# Patient Record
Sex: Female | Born: 1935 | Race: White | Hispanic: No | Smoking: Former smoker
Health system: Southern US, Community
[De-identification: ages and names within clinical notes are randomized; demographics above are authoritative.]

## PROBLEM LIST (undated history)

## (undated) DIAGNOSIS — F0280 Dementia in other diseases classified elsewhere without behavioral disturbance: Secondary | ICD-10-CM

## (undated) DIAGNOSIS — E042 Nontoxic multinodular goiter: Secondary | ICD-10-CM

## (undated) DIAGNOSIS — R634 Abnormal weight loss: Secondary | ICD-10-CM

## (undated) DIAGNOSIS — K5900 Constipation, unspecified: Secondary | ICD-10-CM

## (undated) DIAGNOSIS — F419 Anxiety disorder, unspecified: Secondary | ICD-10-CM

## (undated) DIAGNOSIS — K5730 Diverticulosis of large intestine without perforation or abscess without bleeding: Secondary | ICD-10-CM

## (undated) DIAGNOSIS — S42209A Unspecified fracture of upper end of unspecified humerus, initial encounter for closed fracture: Secondary | ICD-10-CM

## (undated) DIAGNOSIS — G309 Alzheimer's disease, unspecified: Principal | ICD-10-CM

## (undated) DIAGNOSIS — I10 Essential (primary) hypertension: Secondary | ICD-10-CM

## (undated) DIAGNOSIS — N8111 Cystocele, midline: Secondary | ICD-10-CM

## (undated) DIAGNOSIS — M25519 Pain in unspecified shoulder: Secondary | ICD-10-CM

## (undated) DIAGNOSIS — I4891 Unspecified atrial fibrillation: Secondary | ICD-10-CM

## (undated) DIAGNOSIS — G8929 Other chronic pain: Secondary | ICD-10-CM

## (undated) DIAGNOSIS — R627 Adult failure to thrive: Secondary | ICD-10-CM

## (undated) DIAGNOSIS — S72309A Unspecified fracture of shaft of unspecified femur, initial encounter for closed fracture: Secondary | ICD-10-CM

## (undated) HISTORY — DX: Nontoxic multinodular goiter: E04.2

## (undated) HISTORY — DX: Cystocele, midline: N81.11

## (undated) HISTORY — PX: TONSILLECTOMY: SHX5217

## (undated) HISTORY — DX: Essential (primary) hypertension: I10

## (undated) HISTORY — DX: Unspecified atrial fibrillation: I48.91

## (undated) HISTORY — DX: Other chronic pain: G89.29

## (undated) HISTORY — DX: Alzheimer's disease, unspecified: G30.9

## (undated) HISTORY — DX: Adult failure to thrive: R62.7

## (undated) HISTORY — DX: Unspecified fracture of shaft of unspecified femur, initial encounter for closed fracture: S72.309A

## (undated) HISTORY — DX: Abnormal weight loss: R63.4

## (undated) HISTORY — DX: Unspecified fracture of upper end of unspecified humerus, initial encounter for closed fracture: S42.209A

## (undated) HISTORY — DX: Constipation, unspecified: K59.00

## (undated) HISTORY — DX: Anxiety disorder, unspecified: F41.9

## (undated) HISTORY — DX: Dementia in other diseases classified elsewhere without behavioral disturbance: F02.80

## (undated) HISTORY — DX: Pain in unspecified shoulder: M25.519

## (undated) HISTORY — DX: Diverticulosis of large intestine without perforation or abscess without bleeding: K57.30

---

## 1998-01-22 ENCOUNTER — Encounter (HOSPITAL_COMMUNITY): Admission: RE | Admit: 1998-01-22 | Discharge: 1998-04-22 | Payer: Self-pay | Admitting: Cardiology

## 1998-04-09 ENCOUNTER — Encounter (HOSPITAL_COMMUNITY): Admission: RE | Admit: 1998-04-09 | Discharge: 1998-07-08 | Payer: Self-pay | Admitting: Cardiology

## 1998-05-26 ENCOUNTER — Ambulatory Visit (HOSPITAL_COMMUNITY): Admission: RE | Admit: 1998-05-26 | Discharge: 1998-05-26 | Payer: Self-pay | Admitting: Family Medicine

## 1998-05-26 ENCOUNTER — Encounter: Payer: Self-pay | Admitting: Family Medicine

## 1998-12-11 ENCOUNTER — Ambulatory Visit (HOSPITAL_COMMUNITY): Admission: RE | Admit: 1998-12-11 | Discharge: 1998-12-11 | Payer: Self-pay | Admitting: Family Medicine

## 1999-01-12 ENCOUNTER — Other Ambulatory Visit: Admission: RE | Admit: 1999-01-12 | Discharge: 1999-01-12 | Payer: Self-pay | Admitting: Family Medicine

## 1999-01-18 ENCOUNTER — Encounter: Payer: Self-pay | Admitting: Family Medicine

## 1999-01-18 ENCOUNTER — Ambulatory Visit (HOSPITAL_COMMUNITY): Admission: RE | Admit: 1999-01-18 | Discharge: 1999-01-18 | Payer: Self-pay | Admitting: Family Medicine

## 1999-10-18 ENCOUNTER — Encounter: Admission: RE | Admit: 1999-10-18 | Discharge: 1999-10-18 | Payer: Self-pay | Admitting: Family Medicine

## 1999-10-18 ENCOUNTER — Encounter: Payer: Self-pay | Admitting: Family Medicine

## 2000-01-28 ENCOUNTER — Emergency Department (HOSPITAL_COMMUNITY): Admission: EM | Admit: 2000-01-28 | Discharge: 2000-01-28 | Payer: Self-pay | Admitting: Emergency Medicine

## 2000-01-29 ENCOUNTER — Encounter: Payer: Self-pay | Admitting: Emergency Medicine

## 2000-02-01 ENCOUNTER — Encounter: Admission: RE | Admit: 2000-02-01 | Discharge: 2000-02-01 | Payer: Self-pay | Admitting: Family Medicine

## 2000-02-01 ENCOUNTER — Encounter: Payer: Self-pay | Admitting: Family Medicine

## 2000-02-28 ENCOUNTER — Ambulatory Visit (HOSPITAL_COMMUNITY): Admission: RE | Admit: 2000-02-28 | Discharge: 2000-02-28 | Payer: Self-pay | Admitting: Urology

## 2000-04-13 ENCOUNTER — Encounter: Admission: RE | Admit: 2000-04-13 | Discharge: 2000-04-13 | Payer: Self-pay | Admitting: Family Medicine

## 2000-04-13 ENCOUNTER — Encounter: Payer: Self-pay | Admitting: Family Medicine

## 2000-11-07 ENCOUNTER — Encounter: Admission: RE | Admit: 2000-11-07 | Discharge: 2000-11-07 | Payer: Self-pay | Admitting: Family Medicine

## 2000-11-07 ENCOUNTER — Encounter: Payer: Self-pay | Admitting: Family Medicine

## 2001-10-10 DIAGNOSIS — F419 Anxiety disorder, unspecified: Secondary | ICD-10-CM

## 2001-10-10 DIAGNOSIS — I10 Essential (primary) hypertension: Secondary | ICD-10-CM

## 2001-10-10 DIAGNOSIS — I4891 Unspecified atrial fibrillation: Secondary | ICD-10-CM

## 2001-10-10 DIAGNOSIS — K5730 Diverticulosis of large intestine without perforation or abscess without bleeding: Secondary | ICD-10-CM

## 2001-10-10 HISTORY — DX: Unspecified atrial fibrillation: I48.91

## 2001-10-10 HISTORY — DX: Essential (primary) hypertension: I10

## 2001-10-10 HISTORY — DX: Anxiety disorder, unspecified: F41.9

## 2001-10-10 HISTORY — DX: Diverticulosis of large intestine without perforation or abscess without bleeding: K57.30

## 2002-08-02 ENCOUNTER — Encounter: Payer: Self-pay | Admitting: Internal Medicine

## 2002-08-02 ENCOUNTER — Ambulatory Visit (HOSPITAL_COMMUNITY): Admission: RE | Admit: 2002-08-02 | Discharge: 2002-08-02 | Payer: Self-pay | Admitting: Internal Medicine

## 2002-08-05 ENCOUNTER — Ambulatory Visit (HOSPITAL_COMMUNITY): Admission: RE | Admit: 2002-08-05 | Discharge: 2002-08-06 | Payer: Self-pay | Admitting: Internal Medicine

## 2002-08-05 ENCOUNTER — Encounter: Payer: Self-pay | Admitting: Internal Medicine

## 2002-08-06 ENCOUNTER — Encounter: Payer: Self-pay | Admitting: Internal Medicine

## 2002-09-12 ENCOUNTER — Encounter: Payer: Self-pay | Admitting: Internal Medicine

## 2002-09-12 ENCOUNTER — Ambulatory Visit (HOSPITAL_COMMUNITY): Admission: RE | Admit: 2002-09-12 | Discharge: 2002-09-12 | Payer: Self-pay | Admitting: Internal Medicine

## 2002-10-10 DIAGNOSIS — F0280 Dementia in other diseases classified elsewhere without behavioral disturbance: Secondary | ICD-10-CM

## 2002-10-10 HISTORY — DX: Dementia in other diseases classified elsewhere, unspecified severity, without behavioral disturbance, psychotic disturbance, mood disturbance, and anxiety: F02.80

## 2003-03-16 ENCOUNTER — Emergency Department (HOSPITAL_COMMUNITY): Admission: EM | Admit: 2003-03-16 | Discharge: 2003-03-16 | Payer: Self-pay | Admitting: Emergency Medicine

## 2004-01-30 ENCOUNTER — Other Ambulatory Visit: Admission: RE | Admit: 2004-01-30 | Discharge: 2004-01-30 | Payer: Self-pay | Admitting: Obstetrics and Gynecology

## 2004-02-04 ENCOUNTER — Ambulatory Visit (HOSPITAL_COMMUNITY): Admission: RE | Admit: 2004-02-04 | Discharge: 2004-02-04 | Payer: Self-pay | Admitting: Obstetrics and Gynecology

## 2005-02-17 ENCOUNTER — Ambulatory Visit (HOSPITAL_COMMUNITY): Admission: RE | Admit: 2005-02-17 | Discharge: 2005-02-17 | Payer: Self-pay | Admitting: Internal Medicine

## 2005-02-28 ENCOUNTER — Ambulatory Visit: Payer: Self-pay

## 2005-06-08 ENCOUNTER — Ambulatory Visit: Payer: Self-pay | Admitting: Internal Medicine

## 2005-09-07 ENCOUNTER — Ambulatory Visit: Payer: Self-pay | Admitting: Internal Medicine

## 2005-10-10 DIAGNOSIS — S42209A Unspecified fracture of upper end of unspecified humerus, initial encounter for closed fracture: Secondary | ICD-10-CM

## 2005-10-10 HISTORY — DX: Unspecified fracture of upper end of unspecified humerus, initial encounter for closed fracture: S42.209A

## 2005-10-12 ENCOUNTER — Ambulatory Visit: Payer: Self-pay | Admitting: Internal Medicine

## 2005-11-24 ENCOUNTER — Ambulatory Visit: Payer: Self-pay | Admitting: Internal Medicine

## 2005-12-18 ENCOUNTER — Ambulatory Visit: Payer: Self-pay | Admitting: Cardiology

## 2005-12-18 ENCOUNTER — Inpatient Hospital Stay (HOSPITAL_COMMUNITY): Admission: EM | Admit: 2005-12-18 | Discharge: 2005-12-27 | Payer: Self-pay | Admitting: Emergency Medicine

## 2005-12-23 ENCOUNTER — Encounter: Payer: Self-pay | Admitting: Internal Medicine

## 2005-12-30 ENCOUNTER — Ambulatory Visit: Payer: Self-pay | Admitting: Internal Medicine

## 2006-02-16 ENCOUNTER — Ambulatory Visit: Payer: Self-pay

## 2006-03-10 HISTORY — PX: OTHER SURGICAL HISTORY: SHX169

## 2006-03-30 ENCOUNTER — Ambulatory Visit: Payer: Self-pay | Admitting: Internal Medicine

## 2006-03-31 ENCOUNTER — Emergency Department (HOSPITAL_COMMUNITY): Admission: EM | Admit: 2006-03-31 | Discharge: 2006-04-01 | Payer: Self-pay | Admitting: Emergency Medicine

## 2006-06-21 ENCOUNTER — Emergency Department (HOSPITAL_COMMUNITY): Admission: EM | Admit: 2006-06-21 | Discharge: 2006-06-21 | Payer: Self-pay | Admitting: Emergency Medicine

## 2007-04-05 ENCOUNTER — Ambulatory Visit: Payer: Self-pay | Admitting: Internal Medicine

## 2007-04-05 LAB — CONVERTED CEMR LAB
CO2: 31 meq/L (ref 19–32)
Chloride: 106 meq/L (ref 96–112)
Eosinophils Relative: 2.9 % (ref 0.0–5.0)
GFR calc non Af Amer: 55 mL/min
Glucose, Bld: 86 mg/dL (ref 70–99)
HCT: 35.4 % — ABNORMAL LOW (ref 36.0–46.0)
Hemoglobin: 12.2 g/dL (ref 12.0–15.0)
MCHC: 34.4 g/dL (ref 30.0–36.0)
Monocytes Relative: 6.5 % (ref 3.0–11.0)
Potassium: 4 meq/L (ref 3.5–5.1)
RBC: 3.85 M/uL — ABNORMAL LOW (ref 3.87–5.11)
RDW: 14.5 % (ref 11.5–14.6)
Sodium: 141 meq/L (ref 135–145)

## 2007-05-18 ENCOUNTER — Ambulatory Visit: Payer: Self-pay | Admitting: Internal Medicine

## 2007-06-09 ENCOUNTER — Emergency Department (HOSPITAL_COMMUNITY): Admission: EM | Admit: 2007-06-09 | Discharge: 2007-06-10 | Payer: Self-pay | Admitting: Surgery

## 2007-06-13 ENCOUNTER — Ambulatory Visit: Payer: Self-pay | Admitting: Internal Medicine

## 2007-07-11 ENCOUNTER — Ambulatory Visit: Payer: Self-pay | Admitting: Internal Medicine

## 2007-08-08 ENCOUNTER — Ambulatory Visit: Payer: Self-pay | Admitting: Internal Medicine

## 2007-09-05 ENCOUNTER — Ambulatory Visit: Payer: Self-pay | Admitting: Internal Medicine

## 2007-10-03 ENCOUNTER — Ambulatory Visit: Payer: Self-pay | Admitting: Internal Medicine

## 2007-10-11 DIAGNOSIS — S72309A Unspecified fracture of shaft of unspecified femur, initial encounter for closed fracture: Secondary | ICD-10-CM

## 2007-10-11 HISTORY — PX: ORIF HIP FRACTURE: SHX2125

## 2007-10-11 HISTORY — DX: Unspecified fracture of shaft of unspecified femur, initial encounter for closed fracture: S72.309A

## 2007-10-14 ENCOUNTER — Ambulatory Visit: Payer: Self-pay | Admitting: Cardiology

## 2007-10-14 ENCOUNTER — Inpatient Hospital Stay (HOSPITAL_COMMUNITY): Admission: EM | Admit: 2007-10-14 | Discharge: 2007-10-25 | Payer: Self-pay | Admitting: Emergency Medicine

## 2007-12-03 ENCOUNTER — Ambulatory Visit: Payer: Self-pay | Admitting: Internal Medicine

## 2008-03-04 ENCOUNTER — Ambulatory Visit: Payer: Self-pay | Admitting: Internal Medicine

## 2008-03-25 ENCOUNTER — Ambulatory Visit: Payer: Self-pay | Admitting: Internal Medicine

## 2008-06-03 ENCOUNTER — Ambulatory Visit: Payer: Self-pay | Admitting: Internal Medicine

## 2008-07-26 IMAGING — RF DG HIP OPERATIVE*R*
1 series · 4 of 4 positions shown · non-contrast
Comparison: [HOSPITAL] preoperative right hip radiograph 10/14/07.

CLINICAL DATA: ORIF right hip fractures.
 RIGHT HIP OPERATIVE ? 4 VIEWS ? 10/17/07:

[Series 1: run · 4 of 4 slices shown]
[im 1/4]
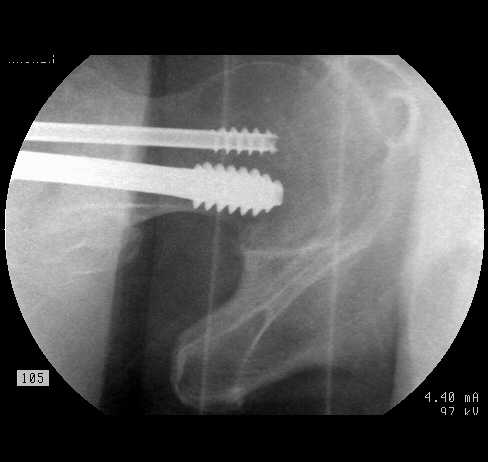
[im 2/4]
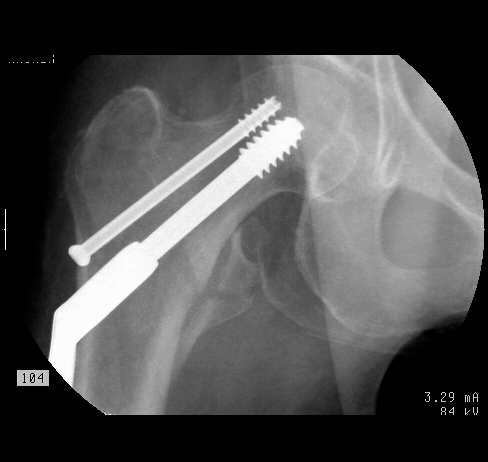
[im 3/4]
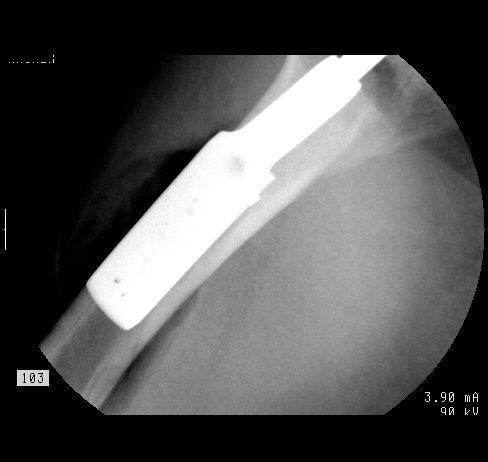
[im 4/4]
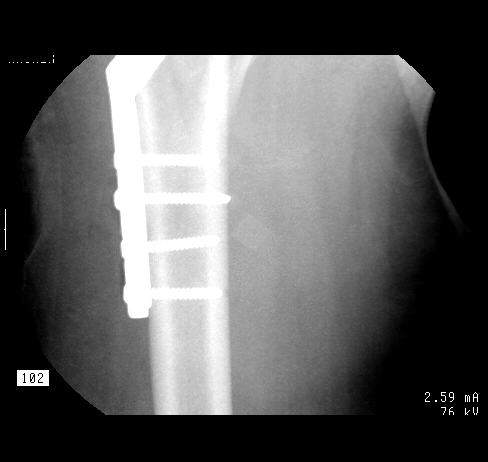

[4 of 4 positions shown; findings below may reference images not displayed]

FINDINGS: Interval placement of right hip screws and external plate and screws transfix the previous varus angulated comminuted right femoral intertrochanteric fracture with the major fracture fragments in satisfactory position and alignment.  Metallic screws end in satisfactory position at central right femoral head.
IMPRESSION: Satisfactory open reduction and internal fixation of previous varus angulated comminuted right intertrochanteric femoral fracture.

## 2008-10-10 DIAGNOSIS — N8111 Cystocele, midline: Secondary | ICD-10-CM

## 2008-10-10 HISTORY — DX: Cystocele, midline: N81.11

## 2008-10-14 ENCOUNTER — Ambulatory Visit: Payer: Self-pay | Admitting: Internal Medicine

## 2009-01-16 ENCOUNTER — Encounter (INDEPENDENT_AMBULATORY_CARE_PROVIDER_SITE_OTHER): Payer: Self-pay | Admitting: Radiology

## 2009-02-06 ENCOUNTER — Ambulatory Visit: Payer: Self-pay | Admitting: Internal Medicine

## 2009-02-21 DIAGNOSIS — I10 Essential (primary) hypertension: Secondary | ICD-10-CM | POA: Insufficient documentation

## 2009-02-21 DIAGNOSIS — K589 Irritable bowel syndrome without diarrhea: Secondary | ICD-10-CM | POA: Insufficient documentation

## 2009-02-21 DIAGNOSIS — G309 Alzheimer's disease, unspecified: Secondary | ICD-10-CM

## 2009-02-21 DIAGNOSIS — K5730 Diverticulosis of large intestine without perforation or abscess without bleeding: Secondary | ICD-10-CM | POA: Insufficient documentation

## 2009-02-21 DIAGNOSIS — Z950 Presence of cardiac pacemaker: Secondary | ICD-10-CM | POA: Insufficient documentation

## 2009-02-21 DIAGNOSIS — I498 Other specified cardiac arrhythmias: Secondary | ICD-10-CM | POA: Insufficient documentation

## 2009-02-21 DIAGNOSIS — F0280 Dementia in other diseases classified elsewhere without behavioral disturbance: Secondary | ICD-10-CM | POA: Insufficient documentation

## 2009-02-21 DIAGNOSIS — I4891 Unspecified atrial fibrillation: Secondary | ICD-10-CM

## 2009-04-16 ENCOUNTER — Ambulatory Visit: Payer: Self-pay

## 2009-04-16 ENCOUNTER — Encounter: Payer: Self-pay | Admitting: Internal Medicine

## 2009-08-07 ENCOUNTER — Ambulatory Visit: Payer: Self-pay | Admitting: Internal Medicine

## 2009-10-10 DIAGNOSIS — R634 Abnormal weight loss: Secondary | ICD-10-CM

## 2009-10-10 DIAGNOSIS — M25519 Pain in unspecified shoulder: Secondary | ICD-10-CM

## 2009-10-10 DIAGNOSIS — R627 Adult failure to thrive: Secondary | ICD-10-CM

## 2009-10-10 HISTORY — DX: Adult failure to thrive: R62.7

## 2009-10-10 HISTORY — DX: Abnormal weight loss: R63.4

## 2009-10-10 HISTORY — DX: Pain in unspecified shoulder: M25.519

## 2009-10-13 ENCOUNTER — Ambulatory Visit: Payer: Self-pay | Admitting: Internal Medicine

## 2010-01-01 ENCOUNTER — Ambulatory Visit: Payer: Self-pay | Admitting: Internal Medicine

## 2010-06-07 ENCOUNTER — Emergency Department (HOSPITAL_COMMUNITY): Admission: EM | Admit: 2010-06-07 | Discharge: 2010-06-08 | Payer: Self-pay | Admitting: Emergency Medicine

## 2010-08-06 ENCOUNTER — Encounter (INDEPENDENT_AMBULATORY_CARE_PROVIDER_SITE_OTHER): Payer: Self-pay | Admitting: *Deleted

## 2010-11-08 ENCOUNTER — Encounter: Payer: Self-pay | Admitting: Internal Medicine

## 2010-11-11 NOTE — Assessment & Plan Note (Signed)
Summary: pacer check/medtronic   CC:  pacer check.  History of Present Illness:    [name] is seen in followup for pacemaker implanted years ago for symptomatic bradycardia. She also has severe dementia. She denies symptoms of chest pain shortness of breath peripheral edema.  Current Medications (verified): 1)  Norvasc 5 Mg Tabs (Amlodipine Besylate) .... One By Mouth Daily 2)  Zoloft 50 Mg Tabs (Sertraline Hcl) .... Take 2 and 1/2 By Mouth Once Daily 3)  Restasis 0.05 % Emul (Cyclosporine) .... One Drop Both Eyes Two Times A Day 4)  Namenda 10 Mg Tabs (Memantine Hcl) .... One By Mouth Two Times A Day 5)  Tylenol Extra Strength 500 Mg Tabs (Acetaminophen) .... Two By Mouth Two Times A Day 6)  Depakote 250 Mg Tbec (Divalproex Sodium) .... One By Mouth Three Times A Day 7)  Aricept 5 Mg Tabs (Donepezil Hcl) .... One By Mouth At Bedtime 8)  Ativan 0.5 Mg Tabs (Lorazepam) .... One Half By Mouth Two Times A Day As Needed 9)  Xalatan 0.005 % Soln (Latanoprost) .Marland Kitchen.. 1 Gtt in Affected Eye Daily 10)  Vicodin 5-500 Mg Tabs (Hydrocodone-Acetaminophen) .... As Needed  Allergies (verified): 1)  * Codeine 2)  * Hydrocodone  Past History:  Past Medical History: Last updated: 02/21/2009 BRADYCARDIA (ICD-427.89) ALZHEIMER'S DISEASE (ICD-331.0) IRRITABLE BOWEL SYNDROME (ICD-564.1) DIVERTICULOSIS, COLON (ICD-562.10) HYPERTENSION, UNSPECIFIED (ICD-401.9) ATRIAL FIBRILLATION (ICD-427.31) PACEMAKER (ICD-V45.Marland Kitchen01)    Past Surgical History: Last updated: 02/21/2009 hip - 10/2007 pacemaker - 10/03 - Medtronic  Social History: Last updated: 02/21/2009 Tobacco Use - No.  Alcohol Use - no  Vital Signs:  Patient profile:   77 year old female Height:      65 inches Pulse rate:   71 / minute Pulse rhythm:   regular BP sitting:   103 / 58  (left arm) Cuff size:   regular  Vitals Entered By: Judithe Modest CMA (October 13, 2009 3:38 PM)  Physical Exam  General:  The patient was alert  and oriented in no acute distress.she is oriented to name only HEENT Normal with few teeth  Neck veins were flat,   Lungs were clear.  Heart sounds were regular without murmurs or  . There is no clubbing cyanosis or edema. Skin Warm and dry    PPM Specifications Following MD:  Sherryl Manges, MD     PPM Vendor:  Medtronic     PPM Model Number:  [id number]     PPM Serial Number:  [id number] PPM DOI:  08/07/2002     PPM Implanting MD:  Sherryl Manges, MD  Lead 1    Location: RV     DOI: 08/07/2002     Model #: [id number]     Serial #: [id number]     Status: active  Magnet Response Rate:  BOL 85 ERI  65  Indications:  CHB   PPM Follow Up Remote Check?  No Battery Voltage:  2.76 V     Battery Est. Longevity:  2.5 years     Pacer Dependent:  Yes     Right Ventricle  Amplitude: 2.8 mV, Impedance: 563 ohms, Threshold: 0.5 V at 0.4 msec  Episodes Coumadin:  No Ventricular High Rate:  3     Ventricular Pacing:  98.4%  Parameters Mode:  VVIR     Lower Rate Limit:  70     Upper Rate Limit:  120 Next Cardiology Appt Due:  04/09/2010 Tech Comments:  No parameter changes.  3 VHR  episodes the longest 2 seconds, 0.3% heart rates >180bpm.  Rate response adequate for the patient's level of activity.  She is wheelchair restricted.  ROV 6 months clinic. Altha Harm, LPN  October 13, 2009 3:49 PM  MD Comments:  review  Impression & Recommendations:  Problem # 1:  BRADYCARDIA (ICD-427.89) she is almost 100% ventricularly paced. Her updated medication list for this problem includes:    Norvasc 5 Mg Tabs (Amlodipine besylate) ..... One by mouth daily  Problem # 2:  PACEMAKER (ICD-V45.Marland Kitchen01) normal device function

## 2010-11-11 NOTE — Miscellaneous (Signed)
Summary: dx correction  Clinical Lists Changes  Problems: Changed problem from PACEMAKER (ICD-V45..01) to PACEMAKER, PERMANENT (ICD-V45.01)  changed the incorrect dx code to correct dx code 

## 2010-11-11 NOTE — Cardiovascular Report (Signed)
Summary: TTM  TTM   Imported By: Roderic Ovens 01/12/2010 16:03:31  _____________________________________________________________________  External Attachment:    Type:   Image     Comment:   External Document

## 2010-11-12 NOTE — Cardiovascular Report (Signed)
Summary: Office Visit   Office Visit   Imported By: Roderic Ovens 10/15/2009 15:23:22  _____________________________________________________________________  External Attachment:    Type:   Image     Comment:   External Document

## 2010-11-17 NOTE — Miscellaneous (Signed)
Summary: Corrected device information  Clinical Lists Changes  Observations: Added new observation of PPM MODL#: [id number] (11/08/2010 8:40)      PPM Specifications Following MD:  Sherryl Manges, MD     PPM Vendor:  Medtronic     PPM Model Number:  [id number]     PPM Serial Number:  [id number] PPM DOI:  08/07/2002     PPM Implanting MD:  Sherryl Manges, MD  Lead 1    Location: RV     DOI: 08/07/2002     Model #: [id number]     Serial #: [id number]     Status: active  Magnet Response Rate:  BOL 85 ERI  65  Indications:  CHB   PPM Follow Up Pacer Dependent:  Yes      Episodes Coumadin:  No  Parameters Mode:  VVIR     Lower Rate Limit:  70     Upper Rate Limit:  120

## 2010-12-23 LAB — PROTIME-INR
INR: 1.21 (ref 0.00–1.49)
Prothrombin Time: 15.5 seconds — ABNORMAL HIGH (ref 11.6–15.2)

## 2010-12-23 LAB — POCT I-STAT, CHEM 8
BUN: 19 mg/dL (ref 6–23)
Calcium, Ion: 0.98 mmol/L — ABNORMAL LOW (ref 1.12–1.32)
Chloride: 104 meq/L (ref 96–112)
Creatinine, Ser: 0.8 mg/dL (ref 0.4–1.2)
Glucose, Bld: 159 mg/dL — ABNORMAL HIGH (ref 70–99)
HCT: 39 % (ref 36.0–46.0)
Hemoglobin: 13.3 g/dL (ref 12.0–15.0)
Potassium: 3.8 meq/L (ref 3.5–5.1)
Sodium: 139 meq/L (ref 135–145)
TCO2: 23 mmol/L (ref 0–100)

## 2010-12-23 LAB — URINE MICROSCOPIC-ADD ON

## 2010-12-23 LAB — URINE CULTURE
Colony Count: >=100000
Culture  Setup Time: 201108290100

## 2010-12-23 LAB — DIFFERENTIAL
Basophils Absolute: 0 10*3/uL (ref 0.0–0.1)
Basophils Relative: 0 % (ref 0–1)
Neutro Abs: 6 10*3/uL (ref 1.7–7.7)
Neutrophils Relative %: 75 % (ref 43–77)

## 2010-12-23 LAB — CLOSTRIDIUM DIFFICILE EIA

## 2010-12-23 LAB — CULTURE, BLOOD (ROUTINE X 2): Culture: NO GROWTH

## 2010-12-23 LAB — CBC
Hemoglobin: 12.2 g/dL (ref 12.0–15.0)
MCHC: 33.2 g/dL (ref 30.0–36.0)
RDW: 13.8 % (ref 11.5–15.5)

## 2010-12-23 LAB — URINALYSIS, ROUTINE W REFLEX MICROSCOPIC
Glucose, UA: NEGATIVE mg/dL
Specific Gravity, Urine: 1.009 (ref 1.005–1.030)
Urobilinogen, UA: 1 mg/dL (ref 0.0–1.0)

## 2010-12-23 LAB — HEMOCCULT GUIAC POC 1CARD (OFFICE): Fecal Occult Bld: NEGATIVE

## 2011-02-22 NOTE — Assessment & Plan Note (Signed)
[facility]                         ELECTROPHYSIOLOGY OFFICE NOTE   NAME:[name], LEILAH POLIMENI                       MRN:          [id number]  DATE:10/14/2008                            DOB:          1916/06/01    Ms. Weingartner returns today for followup.  She is very pleasantly demented  elderly woman with a history of symptomatic bradycardia and atrial  fibrillation, status post pacemaker insertion.  She returns today for  followup.  She denies chest pain.  She denies shortness of breath.  Today, she has an ecchymotic area over her right eye.  She does not know  how she got it.   PHYSICAL EXAMINATION:  VITAL SIGNS:  Her blood pressure was 150/70, the  pulse 72 and regular, and the respirations were 18.  Weight was not  obtained secondary to her being in a wheelchair.  NECK:  No jugular venous distention.  LUNGS:  Clear bilateral to auscultation.  No wheezes, rales, or rhonchi  are present.  There is no increased work of breathing.  CARDIOVASCULAR:  Regular rate and rhythm.  Normal S1 and S2.  ABDOMEN:  Soft, nontender.  EXTREMITIES:  Demonstrate no edema.   Interrogation of a pacemaker demonstrates a Medtronic Sigma, the R-waves  were 5, the impedance 497, the threshold 0.5 at 0.4.  The battery  voltage was 2.77 volts.  Underlying rhythm was AFib at 53 beats per  minute.  Her estimated battery longevity of her device is 3-1/2 years.   IMPRESSION:  1. Symptomatic bradycardia.  2. Status post pacemaker insertion.  3. Severe dementia.   DISCUSSION:  Overall, Ms. Momon is stable.  Her heart rate is well  controlled.  I will see the patient back in the office in 1 year.     Doylene Canning. Ladona Ridgel, MD  Electronically Signed    GWT/MedQ  DD: 10/14/2008  DT: 10/15/2008  Job #: [id number]

## 2011-02-22 NOTE — Assessment & Plan Note (Signed)
[name]                         ELECTROPHYSIOLOGY OFFICE NOTE   NAME:[name], [name] HOAR                       MRN:          [id number]  DATE:03/25/2008                            DOB:          1916-02-27    [name] returns today for followup.  She is a very pleasant elderly  woman with complete heart block, hypertension, and fairly marked  dementia.  She has a history of pacemaker implantation dating back to  49.  She otherwise has been very stable.  The patient cannot remember  her age.  She thinks she is 81 when asking about her problems, she  states that she is falling apart.   MEDICATIONS:  1. Microzide (HCTZ 12.5 mg) half tablet daily.  2. Zoloft 100 mg daily.  3. Potassium supplements.  4. Coumadin 4 mg daily.  5. Divalproic daily.   PHYSICAL EXAMINATION:  GENERAL:  She is a pleasant, elderly-appearing  woman, who is in no acute distress.  VITAL SIGNS:  Her blood pressure was 128/75, the pulse is 76 and  regular, respirations were 18.  Weight was not obtained secondary to her  wheelchair dependence.  NECK:  Revealed no jugular distention.  LUNGS:  Clear bilaterally to auscultation.  No wheezes, rales, or  rhonchi are present.  CARDIOVASCULAR:  Revealed regular rate and rhythm.  Normal S1 and S2.  EXTREMITIES:  Demonstrated no edema.   Interrogation of her pacemaker demonstrates a Medtronic Sigma implanted  in October 2003.  The R-waves were 4, the impedance 456 in the  ventricle, threshold 0.5 and 0.4.  Battery longevity was 3-1/2 years.  Her underlying rhythm is Afib, she was 95% V paced.   IMPRESSION:  1. Symptomatic bradycardia.  2. Complete heart block.  3. Chronic atrial fibrillation.  4. Coumadin therapy.  5. Dementia.   DISCUSSION:  The patient is stable.  I am a bit concerned about ongoing  Coumadin therapy for this patient though her thromboembolic risk is  quite high if she were not to take Coumadin.  She  has not  injured herself with falling at this time.  We will plan to see  her back for pacemaker check in 1 year.     [name]. [name] Ridgel, MD  Electronically Signed    GWT/MedQ  DD: 03/25/2008  DT: 03/26/2008  Job #: [id number]   cc:   [name]. [name], M.D.

## 2011-02-22 NOTE — Consult Note (Signed)
[name], [name]                ACCOUNT NO.:  [id number]   MEDICAL RECORD NO.:  [id number]          PATIENT TYPE:  INP   LOCATION:  1410                         FACILITY:  [facility]   PHYSICIAN:  Luis Abed, MD, FACCDATE OF BIRTH:  [birth year]   DATE OF CONSULTATION:  10/17/2007  DATE OF DISCHARGE:                                 CONSULTATION   The patient is a patient of Dr. Frederik Pear at [facility].  She has a  fractured hip and needs surgery.  She has been hospitalized and has been  very nicely managed by the [facility] team.  She has known  chronic atrial fibrillation.  She has been on Coumadin, and this has  been held.  She has a history of pacemaker for bradycardia.  The  pacemaker is working well.  There was question of malfunction of her  pacemaker on October 17, 2007.  I have reviewed the strips in the chart.  All of the previous strips show normal function.  There is one strip  tape in the chart that I believe is not at the correct patient.  Therefore, there is no evidence of pacemaker malfunction.  The patient  saw Dr. Ladona Ridgel in the office for pacemaker check in June 2008, and the  pacer was working well.   PAST MEDICAL HISTORY:   ALLERGIES:  CODEINE.   MEDICATIONS:  Prior to admission she was on Microzide, Aricept, Namenda,  Ativan, Coumadin, Depakote, potassium, Lidoderm patches and Zoloft.   SOCIAL HISTORY:  She is a nursing home resident.  She does not smoke.   FAMILY HISTORY:  The family history is noncontributory.   REVIEW OF SYSTEMS:  Review of Systems is limited at this point.  She has  dementia, and her history is not clear.  Review of Systems otherwise is  negative.   PHYSICAL EXAMINATION:  GENERAL:  The patient is 77 years old.  VITAL SIGNS:  Blood pressure is 150/90 with a pulse of 70.  She is  afebrile.  HEENT:  The patient's extraocular motions are normal.  NECK:  There is no carotid bruit.  There is no jugular venous  distention.  LUNGS:  Reveal scattered mild rhonchi.  CARDIAC:  Exam reveals S1 with an S2.  There are no clicks or  significant murmurs.  ABDOMEN:  Bowel sounds are present.  EXTREMITIES:  She has her right leg immobilized, and there is no edema  in the left leg.   The EKG strips revealed pacing.   The 12-lead EKG reveals pacing with underlying atrial fibrillation.   Echocardiogram report was done.  The ejection fraction was 60-70% with  mild MR and mild AI.   PROBLEM LIST:  1. History of chronic atrial fibrillation with rate controlled.  2. Status post permanent transvenous demand pacemaker for bradycardia      and heart block in the past.  It is a Medtronic Sigma pacer.  It      was checked formally in June 2008.  Function is normal at this      time.  3. Alzheimer's dimension.  4. Hypertension  5. Coumadin therapy with her Coumadin stopped.  6. Anemia, treated.  7. Fractured hip.  She needs surgery.  8. Good left ventricular function.   All data is reviewed.  As mentioned above, there had been question of  pacemaker function.  I believe the strip in the chart is not this  patient.  Also I believe there is normal pacer function.  At this time  from the cardiac viewpoint, the patient can be cleared for her hip  surgery.  No further cardiac workup is recommended.   I was asked to consult on the patient immediately to help see the  patient and move things along so that her surgery could be moved along.      Luis Abed, MD, [facility]  Electronically Signed     JDK/MEDQ  D:  10/17/2007  T:  10/17/2007  Job:  [id number]   cc:   Doylene Canning. Ladona Ridgel, MD  [address]  [address]  [place]  [place] [address]   Lenon Curt. Chilton Si, M.D.  Fax: [phone]

## 2011-02-22 NOTE — H&P (Signed)
NAMESTEPHENIA, [name]                ACCOUNT NO.:  [id number]   MEDICAL RECORD NO.:  [id number]          PATIENT TYPE:  INP   LOCATION:  1410                         FACILITY:  [facility]   PHYSICIAN:  Della Goo, M.D. DATE OF BIRTH:  [birth year]   DATE OF ADMISSION:  10/14/2007  DATE OF DISCHARGE:                              HISTORY & PHYSICAL   PRIMARY CARE PHYSICIAN:  Dr. Frederik Pear   CHIEF COMPLAINT:  Right hip pain.   HISTORY OF PRESENT ILLNESS:  This is an 77 year old female resident of  [facility] who fell out of bed in the evening and injured  her right hip.  She was sent by emergency medical services to the  emergency department, evaluated and had an x-ray performed which  revealed a right intertrochanteric fracture.  Orthopedic surgery on call  was also notified and Dr. Darrelyn Hillock was consulted.   PAST MEDICAL HISTORY:  1. Atrial fibrillation status post pacemaker.  2. Hypertension.  3. Diverticulosis.  4. Irritable bowel syndrome.  5. Alzheimer's dementia.   MEDICATIONS:  Microzide, Aricept, Namenda Ativan, Coumadin, Depakote,  potassium chloride, Lidoderm patches p.r.n. and Zoloft.   ALLERGIES:  CODEINE.   SOCIAL HISTORY:  Nursing home resident, nonsmoker, nondrinker.   FAMILY HISTORY:  Noncontributory.   REVIEW OF SYSTEMS:  The patient had no chest pain, shortness of breath,  nausea, vomiting, diarrhea, fevers, chills or loss of consciousness or  dizziness.   PHYSICAL EXAMINATION:  General:  An 77 year old well-nourished, well-  developed female in discomfort but no acute distress.  VITAL SIGNS:  Temperature 97.4, blood pressure 153/112, heart rate 75,  respirations 18, O2 sats 97% on room air.  HEENT:  Normocephalic, atraumatic.  Pupils equally round react to light.  Extraocular muscles are intact funduscopic benign oropharynx is clear.  NECK:  Supple full range of motion.  No thyromegaly, adenopathy,  jugulovenous distention.  CARDIOVASCULAR:   Regular rate and rhythm.  No murmurs, gallops or rubs.  LUNGS:  Clear to auscultation bilaterally.  ABDOMEN:  Positive bowel sounds, soft, nontender, nondistended.  EXTREMITIES:  Without cyanosis, clubbing or edema.  The patient does  have multiple prominent varicosities on both lower legs.  NEUROLOGIC:  The patient is awake, alert and oriented x2.   LABORATORY STUDIES:  White blood cell count 9.2, hemoglobin 11.0,  hematocrit 31.7, platelets 166, MCV 91.3, neutrophils 78%, lymphocytes  15%.  Sodium 135, potassium 3.2, chloride 99, bicarb 24, BUN 17,  creatinine 1.08 and glucose 145, calcium level 8.8.  Protime 27.9, INR  2.5, PTT 45.  Urinalysis negative.  X-ray of the right hip reveals an  acute right intertrochanteric fracture.   ASSESSMENT:  An 77 year old female being admitted with:  1. Right intertrochanteric hip fracture/right hip pain.  2. Status post fall.  3. Atrial fibrillation.  4. Hypertension.  5. Hypokalemia.  6. Coagulopathy secondary to Coumadin therapy.  7. Dementia.   PLAN:  The patient will be admitted to telemetry area for cardiac  monitoring, strict Is and Os will be performed.  Cardiac enzymes will  also be performed.  The patient's Coumadin  will be placed on hold for  now and FFP x1 unit will be given.  A repeat PT/INR will be checked to  see if a goal INR of less than 1.5 has been achieved.  After the  patient's Coumadin level has been reversed, an IV heparin drip has been  advised to be ordered.  The patient will continue on her other regular  medications.  Pain control therapy has been ordered along with DVT and  GI prophylaxis.  The patient's potassium will be repleted and her  electrolytes will be monitored as well.  The patient is on bedrest.  Orthopedics has been consulted as mentioned above.      Della Goo, M.D.  Electronically Signed     HJ/MEDQ  D:  10/14/2007  T:  10/15/2007  Job:  [id number]   cc:   Lenon Curt. Chilton Si, M.D.  Fax:  [phone]

## 2011-02-22 NOTE — Discharge Summary (Signed)
[name], [name]                ACCOUNT NO.:  [id number]   MEDICAL RECORD NO.:  [id number]          PATIENT TYPE:  INP   LOCATION:  1337                         [facility]ACILITY:  [facility]   PHYSICIAN:  Beckey Rutter, MD  DATE OF BIRTH:  [birth year]   DATE OF ADMISSION:  10/14/2007  DATE OF DISCHARGE:  10/25/2007                               DISCHARGE SUMMARY   ADDENDUM:  This discharge summary should be amended to the previously  dictated discharge summary, which includes the discharge medications on  discharge diagnoses.   The patient remained stable since the previously dictated discharge  summary.  Two major issues during the period of week one are her  dysphagia.  The patient failed her modified barium swallow and that was  repeated, with the hope the patient would recover from her weakness.  Unfortunately the second modified barium stool swallow was not  successful and still the recommendation for the patient is to have a PEG  tube if we want to eliminate the risk of aspiration.  The other option  that was laid on by the speech therapist is dysphagia 1/nectar.   I discussed this issue with her son Roe Coombs (the number [phone]) who  stated that the patient herself refused a feeding tube previously, and  now he wanted to honor that decision.  He refused a feeding tube.  The  risk of continuation on dysphagia 1/nectar was explained to the patient  and nevertheless he insisted that feeding tube/PEG tube was not an  option.  He is not going to consider this option.  I also discussed with  him the discharge plan with the dysphagia 1, and he is agreeable and  aware of this plan.  He was also aware and agreeable to the risk of this  plan as discussed thoroughly with him.   PROBLEM #2 - HYPONATREMIA.  The patient developed hyponatremia and that  was repeated time and time again;  but, nevertheless today her potassium  is 3.0.  I will replete her potassium today and check her BMET  tomorrow  before her discharge to the skilled nursing facility, as previously  discussed.      Beckey Rutter, MD  Electronically Signed     EME/MEDQ  D:  10/24/2007  T:  10/24/2007  Job:  [id number]

## 2011-02-22 NOTE — Discharge Summary (Signed)
[name], DOLLINGER                ACCOUNT NO.:  [id number]   MEDICAL RECORD NO.:  [id number]          PATIENT TYPE:  INP   LOCATION:  1410                         FACILITY[facility]  [facility]   PHYSICIAN:  Herbie Saxon, MDDATE OF BIRTH:  January 05, 1916   DATE OF ADMISSION:  10/14/2007  DATE OF DISCHARGE:  10/20/2007                               DISCHARGE SUMMARY   DISCHARGE DIAGNOSIS:  1. Right intertrochanteric hip fracture, status post open reduction      and internal fixation.  2. Atrial fibrillation.  3. Hypertension.  4. Dehydration.  5. Coagulopathy.  6. Dementia.  7. History of falls.   CONSULTANTS:  Luis Abed, MD, [facility]   PROCEDURE:  ORIF, October 17, 2007 by Dr. Salvatore Marvel.'.   RADIOLOGY:  Right hip x-ray of October 17, 2007 shows status post  operative fixation with good alignment of right intertrochanteric hip  fracture.  Hip x-ray of October 14, 2007, on admission, shows right  intertrochanteric fracture.  Chest x-ray on admission shows pacer in place, cardiomegaly  Chest x-ray on October 15, 2007 shows left lower lobe infiltrate, query  aspiration or consolidation or pulmonary edema.  Renal ultrasound of  October 17, 2007, shows no evidence of acute renal pathology.  Foley  catheter in the bladder.   HOSPITAL COURSE:  An 77 year old lady who is a nursing home resident  presented to the emergency room complaining of  right hip pain after  having a fall, x-ray was consistent with right hip fracture.  She also  had moderately elevated blood pressure of 153/112 on admission to this  institution.  INR was 2.5.  She had been on Coumadin for atrial  fibrillation.  The coagulopathy was reversed with fresh frozen plasma  and vitamin K.  The patient was noted to be dehydrated and was gently  hydrated with fluids.  She was also noted to be having difficulty  swallowing and possible aspiration.  She had cardiology clearance prior  to the procedure.   The echocardiogram was showing ejection fraction of  60% to 70%, mild mitral regurg and mild aortic regurg.  Echocardiogram  was repeated.  She also had blood transfused, 1 unit of packed red blood  cells on this admission. Hemoccult was negative.  She had a modified  barium swallow performed on October 19, 2007.   The recommendation is to follow speech therapy recommendation for  dysphagia precautions, consider tube placement if dysphagia does not  improve at a later date if the okay with the family.   DISPOSITION:  Possible discharge to [facility] on  October 20, 2007 after reviewing her chemistry in the morning.  Diet  will be nectar thick, pureed diet which should be heart healthy, low  cholesterol.  Followup with the primary care physician, Dr. Lenon Curt. Green in 3-5  days.  Follow up with [facility] in 3-4 weeks.  Follow up with  orthopedics in 4-6 weeks, or prior to that if necessary.   DISCHARGE MEDICATIONS:  1. Senokot 1 nightly p.r.n.  2. Multivitamin 1 tablet daily.  3. Zoloft  150 mg daily.  4. Depakote 500 mg b.i.d.  5. Namenda 10 mg b.i.d.  6. Os-Cal with Vitamin D 1 tablet t.i.d.  7. Cyclosporine (Restasis) 0.05% ophthalmic solution b.i.d.  8. Xalatan 0.005% 1 drop nightly.  9. Aricept 10 mg nightly.  10.Norvasc 5 mg daily.  11.Cortisone cream topically b.i.d.  12.Coumadin 5 mg nightly.  She is to have PT/INR monitoring twice      weekly the first 2 weeks, and then once a week for 2 weeks.  Goal      INR is  2 to 3, will be monitored for fall and aspiration      precautions.  13.Acetaminophen 650 mg q.6 h. p.r.n.  14.Ambien 5 mg nightly p.r.n. for insomnia.  15.Flexeril 10 mg q.8 h. p.r.n.   DISCHARGE EXAMINATION:  She is an older lady, appearing in no acute  distress.  Temperature 98, pulse 72.  Respiratory rate is 18.  Blood  pressure 147/66.  Clinically pale, not jaundiced, demented.  Mucous  membranes are a little dry.  NECK:   Supple.  No elevated JVD, no thyromegaly.  No carotid bruit.  HEART:  S1 and S2, irregular.  CHEST:  Clear.  Reduced breath sounds bibasilarly.  ABDOMEN: Soft and nontender.  Right hip wound neat, peripheral pulses present, no pedal edema.  LABS  hematocrit prior to transfusion is 23,  BUN 36, creatinine 2.8, INR is  1.2.  She is going to be transfused 2 units of packed red blood cells on  October 19, 2007 and  will repeating CBC and BMP prior to discharge to  the nursing home in the a.m.      Herbie Saxon, MD  Electronically Signed     MIO/MEDQ  D:  10/19/2007  T:  10/19/2007  Job:  [id number]

## 2011-02-22 NOTE — Op Note (Signed)
[name], [name]                ACCOUNT NO.:  [id number]   MEDICAL RECORD NO.:  [id number]          PATIENT TYPE:  INP   LOCATION:  1410                         FACILITY:  [facility]   PHYSICIAN:  Robert A. Thurston Hole, M.D. DATE OF BIRTH:  06-30-1916   DATE OF PROCEDURE:  10/17/2007  DATE OF DISCHARGE:                               OPERATIVE REPORT   PREOPERATIVE DIAGNOSIS:  Right hip intertrochanteric fracture.   POSTOPERATIVE DIAGNOSIS:  Right hip intertrochanteric fracture.   OPERATION PERFORMED:  Closed reduction with compression hip screw  intertrochanteric fracture using Synthes 100 mm lag screw with 4-holed  140 degree angle sideplate with separate 7.3 mm 85 mm cannulated screw.   SURGEON:  Elana Alm. Thurston Hole, M.D.   ASSISTANT:  Julien Girt, P.A.   ANESTHESIA:  General.   OPERATIVE TIME:  45 minutes.   ESTIMATED BLOOD LOSS:  150 mL.   COMPLICATIONS:  None.   DESCRIPTION OF PROCEDURE:  [name] was brought to the operating room  on October 17, 2007, placed under general anesthesia on her own hospital  bed and then carefully transferred to the operative fracture table.  Her  left leg was carefully placed in overhead suspension and her right hip  was placed through reduction maneuver and gentle flexion, extension and  longitudinal traction.  The AP and lateral fluoroscopic x-rays were  obtained after the reduction was carried out and showed satisfactory  position and reduction of the fracture.  The leg and foot was stabilized  in the traction holder.  She received Ancef 1 g IV preoperatively for  prophylaxis.  Her right hip and leg was prepped using sterile DuraPrep  and draped using sterile technique.  Initially through a 7 cm  longitudinal incision based over the proximal lateral femur, initial  exposure was made.  Underlying subcutaneous tissues were incised along  with the skin incision.  Iliotibial band and vastus lateralis were  incised longitudinally and then  the underlying lateral proximal femur  was exposed.  Using a 140 degree angled guide, a guide pin was drilled  up into the femoral neck and head under fluoroscopic control and  satisfactory position.  This was then measured and 100 mm was found to  be the appropriate length and then a 100 mm step drill was used to drill  over this under fluoroscopic control and then the 100 mm lag screw was  placed up into the femoral neck and head under fluoroscopic control with  an excellent bite.  The 140 degree x 4 holed angled side plate was then  attached to the lag screw and then attached to the lateral proximal  femur with four separate 4.5 mm cortical screws with a satisfactory and  excellent fixation.  A separate 7.3 mm cannulated screw was then placed  just superior to the lag screw using the guide pin from the cannulated  screw set parallel to the lag screw for extra rotational control.  85 mm  was the appropriate length.  This was then overdrilled and an 85 mm x  7.3 mm screw was drilled up into position with  an excellent fit.  At  this point AP and lateral fluoroscopic x-rays confirmed the anatomic  reduction of fracture and satisfactory position of the hardware.  The  wound was then irrigated and the vastus lateralis was closed with  running 0 Vicryl suture.  Iliotibial band closed with 0 Vicryl suture.  Subcutaneous tissues closed with 0 and 2-0 Vicryl.  Skin closed with skin staples.  Sterile dressings were applied.  The  patient was taken out traction, checked for leg lengths equal, rotation  equal, pulses 2+ and symmetric.  She was then awakened, extubated and  taken to recovery room in stable condition.  Needle and sponge counts  correct x2 at the end of the case.      Robert A. Thurston Hole, M.D.  Electronically Signed     RAW/MEDQ  D:  10/17/2007  T:  10/18/2007  Job:  [id number]

## 2011-02-22 NOTE — Assessment & Plan Note (Signed)
[facility]                         ELECTROPHYSIOLOGY OFFICE NOTE   NAME:Mandrell, ZAILYN THOENNES                       MRN:          [id number]  DATE:04/05/2007                            DOB:          October 06, 1916    Ms. Selvey returns today for followup.  She is a pleasant woman with a  history of complete heart block who now resides in a nursing facility.  She returns today for followup.  She complains of weakness which has  been a frequent complaint in the past dating back to 2004.  Her helper,  who is with her today, states that for the last day-and-a-half she has  felt fatigued and tired and gets dizzy and lightheaded.  She denies  frank syncope, she denies chest pain, she denies shortness of breath.  On exam, she is a pleasant, elderly-appearing woman in no acute  distress.  Her blood pressure is 124/62, the pulse 71 and regular,  respirations were 18, the weight was not recorded.  The neck revealed no  jugular venous distention.  Lungs were clear bilaterally to  auscultation.  No wheezes, rales, or rhonchi.  The cardiovascular exam  revealed a regular rate and rhythm with normal S1 and S2.  The  extremities demonstrated no cyanosis, clubbing or edema.  The pulses  were 2+ and symmetric.   Interrogation of her pacemaker demonstrates a Medtronic Sigma with R  waves of 4, the impedance 541 ohms, the threshold 0.5 at 0.4, the  battery voltage 2.78 volts.   IMPRESSION:  1. Symptomatic bradycardia with complete heart block.  2. Status post pacemaker insertion.  3. Severe fatigue and weakness.   DISCUSSION:  Overall, Ms. Sinko is stable.  However, her fatigue and  weakness is somewhat concerning and I have asked that she undergo CBC  with diff and a BMP to make sure there are no underlying metabolic  abnormalities that are clearly present.  If there in fact is, then we  would recommend additional evaluation.  I will plan to see her back in  the office in  several months.  We will call her should any other testing  be required.     Doylene Canning. Ladona Ridgel, MD  Electronically Signed    GWT/MedQ  DD: 04/05/2007  DT: 04/05/2007  Job #: [id number]   cc:   Lenon Curt. Chilton Si, M.D.

## 2011-02-25 NOTE — Consult Note (Signed)
NAMEANAIS, DENSLOW NO.:  [id number]   MEDICAL RECORD NO.:  [id number]          PATIENT TYPE:  INP   LOCATION:  5019                         FACILITY:  [facility]   PHYSICIAN:  Willa Rough, M.D.     DATE OF BIRTH:  [birth year]   DATE OF CONSULTATION:  12/22/2005  DATE OF DISCHARGE:                                   CONSULTATION   [name] is 77 years old. She lives at [facility].  She fell.  There is no  documentation of true syncope.  She was brought to the emergency room and  had severe comminuted right proximal humeral fracture.  She was admitted for  Dr. Cleophas Dunker for surgical intervention.   She was seen by Dr. Jetty Duhamel for medical consultation on December 18, 2005.  He felt that her overall status was stable for possible surgery, and  he was helping with her overall medical management.   The patient has a history of atrial fibrillation. There is a history of  congenital complete heart block.  She has had pacemakers in place over the  years, and the last pacer replacement was done in 2003 by Dr. Graciela Husbands.  She is  on Coumadin for atrial fibrillation.   Plans were made today to proceed with surgery.  It was then felt that  further cardiac clearance might be necessary.  We are seeing her in  consultation for this.   PAST MEDICAL HISTORY:   ALLERGIES:  CODEINE.   MEDICATIONS:  As an outpatient, she was on:  1.  Lunesta 1 mg at night.  2.  Aricept 10.  3.  Risperdal 1 mg.  4.  Hydrochlorothiazide 12.5.  5.  Zoloft 125.  6.  Restasis eye drops.  7.  Namenda 10 mg twice daily.  8.  Os-Cal.  9.  Coumadin 3 mg Monday, Wednesday,  Friday, Sunday and 4 mg Tuesday,      Thursday, and Saturday.  10. Xalatan eye drops.   OTHER MEDICAL PROBLEMS:  See the complete list below.   SOCIAL HISTORY:  The patient lived at assisted living at [facility].  She  does not smoke.   FAMILY HISTORY:  Noncontributory at this time concerning the patient's  illness at  age 54.   REVIEW OF SYSTEMS:  The patient is quite disoriented at this time.  It is  not possible to take a complete Review of Systems from her.  From review of  the chart, other data, and the family, we know that she is not having any  fevers or chills at this time.  She does wear glasses.  She has ecchymoses  from her falls.  She has some mild chronic shortness of breath but has not  been having chest pain.  She has had some occasional lower extremity edema.  She has not had any GU problems.  She does have problems with anxiety.  Otherwise, her Review of Systems is negative.   PHYSICAL EXAMINATION:  VITAL SIGNS: Temperature 98.7 with a pulse of 70,  respirations 20, and blood pressure 137/75.  O2  saturation is 92% on room  air.  GENERAL: The patient is quite confused.  HEENT:  Reveals areas of ecchymoses that are healing.  NECK:  Reveals no bruits.  CARDIAC: Reveals S1 and S2.  There are no clicks or significant murmurs.  LUNGS: Reveal perfuse scattered rhonchi.  ABDOMEN: Soft.  MUSCULOSKELETAL:  Her shoulder is injured on the right side and not  completely examined.  GU/RECTAL:  Deferred.  EXTREMITIES:  She has no marked edema at this time.   Chest x-ray reveals some cardiomegaly and no active disease.   EKG is paced.   BUN 25, creatinine 1.  Hemoglobin is 9.1.   PROBLEM LIST:  1.  Status post pacemaker for complete heart block related to congenital      complete heart block.  Her last pacemaker was in October 2003, and she      is quite stable.  It will be recommended to use a magnet in the OR while      she is having her surgery.  2.  Atrial fibrillation.  This is chronic, and she is on Coumadin.  3.  Coumadin therapy.  This has been held.  4.  Chronic venous insufficiency.  5.  Hypertension.  6.  Interstitial cystitis.  7.  Diverticulosis.  8.  Alzheimer's dementia.  9.  Anxiety.  10. Irritable bowel.  11. Severe right humeral fracture.   I do agree that it is  appropriate to proceed with repairing this.  The  patient has been up and active at home.  It is important to try to stabilize  her situation.  She is cleared for shoulder surgery.           ______________________________  Willa Rough, M.D.     JK/MEDQ  D:  12/22/2005  T:  12/22/2005  Job:  [id number]   cc:   Duke Salvia, M.D.  [address]  [address]  [place]  [place] [address]   Claude Manges. Cleophas Dunker, M.D.  Fax: [phone]

## 2011-02-25 NOTE — Op Note (Signed)
[place]  Patient:    [name], [name]                       MRN: [id number] Proc. Date: 02/28/00 Adm. Date:  [id number] Disc. Date: [id number] Attending:  Lauree Chandler CC:         Jesse Sans. Wall, M.D. [facility]             Quita Skye. Artis Flock, M.D.                           Operative Report  PREOPERATIVE DIAGNOSIS:  Interstitial cystitis.  POSTOPERATIVE DIAGNOSIS:  Interstitial cystitis.  PROCEDURES: 1. Cystoscopy. 2. Hydraulic dilation of bladder. 3. Cold cup bladder biopsy with fulguration. 4. Instillation of Clorpactin solution.  INDICATIONS:  This 77 year old lady has had a many year history of bladder pain and discomfort and frequent urination with nocturia.  She has had partial response to therapy with multiple instillations of DMSO, Solu-Cortef, and sodium bicarbonate in the office.  She is brought to the OR today for further evaluation and therapy.  She has been off her Coumadin per Maisie Fus C. Wall, M.D.  DESCRIPTION OF PROCEDURE:  The patient was brought to the operating room and placed in the lithotomy position.  The external genitalia were prepped and draped in the usual fashion.  She was cystoscoped and the bladder had no stones, tumors, or inflammatory lesions.  I filled the bladder with sterile water and she held 400 cc.  At this point, I emptied the bladder and looked back in.  She had scattered submucosal petechiae in all quadrants of the bladder.  Cold cup bladder biopsy forceps were utilized to obtain biopsies of typical hemorrhagic areas in the right and left bladder base.  The biopsy sites were fulgurated with the Bugbee electrode while the pacemaker was demagnetized.  At this point, she was felt to have a confirmatory diagnosis of IC and had a previous cystogram in our office which showed no reflux. Therefore, she underwent 10 minutes of in-and-out irrigation with 0.4% Clorpactin and then the bladder was irrigated and  drained of sterile water. She was taken to the recovery room in good condition. DD:  02/28/00 TD:  03/02/00 Job: [id number]

## 2011-02-25 NOTE — Consult Note (Signed)
NAMESARAH-JANE, [name] NO.:  [id number]   MEDICAL RECORD NO.:  [id number]          PATIENT TYPE:  INP   LOCATION:  5019                         FACILITY:  [facility]   PHYSICIAN:  Lonia Blood, M.D.DATE OF BIRTH:  [birth year]   DATE OF CONSULTATION:  12/18/2005  DATE OF DISCHARGE:                                   CONSULTATION   PHYSICIAN REQUESTING CONSULTATION:  Dr. Norlene Campbell   REASON FOR CONSULTATION:  Medical management in the setting of an acute  orthopedic fracture.   HISTORY OF PRESENT ILLNESS:  [name] is a pleasant 77 year old female  who resides at [facility] in the assisted living level. She was in her  usual state of health. Today she fell in her assisted living apartment. The  patient herself is not able to remember the specific events surrounding her  fall. She has been given narcotic pain medication and is somewhat delirious  at the time and cannot provide further history. She was found down in the  floor in her room, however. She was transported to the emergency room.  Subsequent evaluation revealed a severe comminuted right proximal humeral  fracture. She is being admitted to the service of Dr. Norlene Campbell for  surgical intervention. I am asked to see her to assist in stabilizing her  medically in preparing her for surgery.   As noted above, the patient has received narcotic pain medications and is  mildly delirious at this moment. She cannot provide reliable history. Family  members are present and report that she has been in her usual state of  health up until her fall today.   REVIEW OF SYSTEMS:  Comprehensive review of systems is not able to be  accomplished secondary to narcotic pain medication/mild delirium.   PAST MEDICAL HISTORY:  (Per old records)  1.  Status post pacemaker placement secondary to congenital complete heart      block, status post pacemaker change-out August 05, 2002.  2.  Atrial fibrillation on  chronic Coumadin therapy.  3.  Chronic venous insufficiency with bilateral lower extremity edema      chronically.  4.  Hypertension.  5.  Interstitial cystitis.  6.  Diverticulosis.  7.  Irritable bowel syndrome.  8.  Anxiety.  9.  Alzheimer's type dementia.  10. Left anterior shin chronic lower examination ulceration.   OUTPATIENT MEDICATIONS:  1.  Lunesta 1 mg q.h.s.  2.  Aricept 10 mg q.h.s.  3.  Risperdal 1 mg q.h.s.  4.  Hydrochlorothiazide 12.5 mg daily.  5.  Zoloft 125 mg daily.  6.  Restasis 0.05% one drop in each eye b.i.d.  7.  Namenda 10 mg b.i.d.  8.  Os-Cal + D t.i.d.  9.  Coumadin 3 mg every Monday, Wednesday, Friday, and Sunday, and 4 mg      Tuesday, Thursday, Saturday.  10. Xalatan 0.005% ophthalmic solution one drop in the left eye q.h.s.   ALLERGIES:  CODEINE.   FAMILY HISTORY:  Noncontributory secondary to age.   SOCIAL HISTORY:  The patient lives at [facility] in  assisted living  apartment. She does not smoke, she does not drink.   DIET:  No added salt regular diet.   DATA REVIEW:  Hemoglobin 12.5 with MCV of 89, white count mildly elevated at  11.4, platelet count 191. Sodium is normal, potassium is mildly low at 3.5,  chloride and bicarb are normal, BUN is 18, creatinine is 1.1, serum glucose  is elevated at 159. INR is 2.1. A pH is 7.43, pCO2 is 36, pO2 is not  obtainable on a venous gas. X-rays revealed a comminuted right proximal  humeral fracture and CT scan of the head reveals no acute disease with CT  scan of the cervical spine revealing no acute fracture.   PHYSICAL EXAMINATION:  VITAL SIGNS:  Temperature 97.1, blood pressure  176/94, heart rate 71, respiratory rate 32, O2 saturation is 96% on room  air.  GENERAL:  Well-developed, well-nourished female in no acute respiratory  distress.  HEENT:  Obvious facial trauma with abrasion of the anterior surface of the  nose and significant periorbital bruising. Oral cavity reveals no evidence   of acute fractured teeth but poor dental hygiene overall with multiple  fillings and missing teeth.  NECK:  No JVD, no lymphadenopathy.  LUNGS:  Clear to auscultation bilaterally without wheezes or rhonchi.  CARDIOVASCULAR:  Regular rate and rhythm without gallop or rub at the  present time.  ABDOMEN:  Nontender, nondistended, soft, bowel sounds present. No  hepatosplenomegaly, no rebound, no ascites.  EXTREMITIES:  Trace bilateral lower extremity edema to the knees.  NEUROLOGIC:  Alert but not entirely oriented/is confused. Moving left upper  extremity and bilateral lower extremities - the right upper extremity is not  tested due to known severe fracture.  CUTANEOUS:  The patient has a small well-healing stage 1 ulceration of the  left anterior sin with no significant surrounding erythema or purulent  discharge.   RECOMMENDATIONS:  1.  Severe right humeral fracture - the patient's perioperative risks from a      cardiovascular standpoint would be probably most accurately judged as      intermediate. I feel it is reasonable to proceed with surgery and do not      feel that any further risk stratification would significantly impact her      perioperative risks.  2.  Atrial fibrillation - the patient has a longstanding history of atrial      fibrillation. She is on chronic Coumadin therapy. I discussed the need      to discontinue her Coumadin and reverse its effects using vitamin K with      the family and the patient. I have advised them of the increased risk of      stroke in a patient with atrial fibrillation without the protection of      Coumadin. I advised them, however, that it is not possible to proceed      with the surgery with Coumadin on board. They understand the increased      risk and agree with reversing the Coumadin in order to facilitate      surgery in the morning. 3.  Leukocytosis - the patient has a mild leukocytosis. This mostly      represents an acute  demargination due to the stress of her severe      fracture and fall. We will check a urinalysis to assure that there is no      evidence of urinary tract infection which could have contributed to the  patient's fall as well. Otherwise, empiric antibiotics will not be      necessary.  4.  Hypokalemia - this is likely secondary to ongoing hydrochlorothiazide      therapy. I will replace the patient with p.o. potassium tonight. Will      follow up potassium levels and a magnesium level in the morning.  5.  Hyperglycemia - the patient has no known history of diabetes and is not      on any diabetic medications. Serum glucose was noticeably elevated at      159 at time of admission. This may simply represent a stress response to      the patient's acute fall and trauma. We will check a hemoglobin A1c and      we will subsequently follow up CBGs if the A1c is elevated consistently.  6.  Alzheimer's type dementia - we will continue the patient on Namenda and      Aricept during this hospital stay. The patient's acute delirium is felt      to be secondary to narcotic pain medications which are clearly      indicated.  7.  Chronic left lower extremity ulceration - the patient's ulceration does      appear to be improving. We will follow this closely and adhere to wound      care guidelines as to the appropriate management.      Lonia Blood, M.D.  Electronically Signed     JTM/MEDQ  D:  12/18/2005  T:  12/19/2005  Job:  [id number]

## 2011-02-25 NOTE — Op Note (Signed)
[name], BORRERO                          ACCOUNT NO.:  [id number]   MEDICAL RECORD NO.:  [id number]                   PATIENT TYPE:  OIB   LOCATION:  2899                                 FACILITY:  [facility]   PHYSICIAN:  [name], M.D. [facility]           DATE OF BIRTH:  16-Oct-1915   DATE OF PROCEDURE:  08/05/2002  DATE OF DISCHARGE:                                 OPERATIVE REPORT   PREOPERATIVE DIAGNOSES:  1. Permanent atrial fibrillation.  2. Congenital complete heart block.  3. Previously implanted pacemaker, now at [facility].   POSTOPERATIVE DIAGNOSES:  1. Permanent atrial fibrillation.  2. Congenital complete heart block.  3.. Previously implanted pacemaker, now at [facility].   OPERATIONS:  1. Explantation of a previously implanted pacemaker.  2. Insertion of a new ventricular lead as the previously implanted lead was     on advisory recall.  3. Insertion of a new pacemaker.   DESCRIPTION OF PROCEDURE:  Following obtaining informed consent, the patient  was brought to the cardiac catheterization laboratory and placed on the  fluoroscopic table in supine position.  After routine prep and drape of the  left upper chest, intravenous contrast was injected via the left antecubital  vein to demonstrate the course of the previously demonstrated patent  subclavian vein as it crossed the first rim.  This having been accomplished,  lidocaine was infiltrated in the prepectoral subclavicular region about 1 cm  cephalad to the previous incision.  This incision was carried down to the  layer of the lead by using sharp dissection.  The pocket had migrated  inferolaterally and this was exposed.   Given the lack of underlying pacing rhythm, at this juncture, attention was  turned to gaining access to the extrathoracic left subclavian vein which was  accomplished with mild difficulty but without the aspiration of air or  puncture of the artery.  A micropuncture kit was used over which was  passed  a 4-French sheath.  A VanDellen 4/7 French dilator was utilized and then a 7-  [place] tear-away hemostatic introducer sheath was placed through which was  then passed a Medtronic [id number] cm active fixation ventricular lead serial  #[id number].  Given the fact that the patient had three pacemaker leads in  the apex, this lead was placed in the right ventricular outflow tract with a  bipolar R-wave at 4.9 mV which was the paced rhythm, the pacing threshold of  0.9 V at 0.5 ms, a pacing impedance of 788 ohms and current threshold of 1.4  MA.   With these acceptable parameters recorded, the lead was then attached to a  Medtronic Sigma 303 pulse generator, serial #[id number].  Ventricular pacing  was identified.  The previously implanted pacemaker was then extracted from  the pocket.  The lead was exposed.  The Medtronic [id number] 52 cm lead which was  on advisory recall was tapped in the event of  potential need.  It was placed  back to the pocket.  The pocket was copiously irrigated with antibiotic  containing saline solution.  Hemostasis was assured and the lead and the new  pulse generator were then placed in the pocket and secured to the  prepectoral fascia.   The wound was closed in three layers in the normal fashion.  The wound was  then dressed with Dermabond skin adhesive and a gauze was placed upon the  wounds.   The patient tolerated the procedure well without apparent complications.  Sponge, needle and instrument counts were correct at the end of the  procedure according to staff.                                               [name], M.D. [facility]    SCK/MEDQ  D:  08/05/2002  T:  08/05/2002  Job:  [id number]   cc:   Quita Skye. Artis Flock, M.D.

## 2011-02-25 NOTE — Discharge Summary (Signed)
NAMECRISTIE, [name] NO.:  [id number]   MEDICAL RECORD NO.:  [id number]          PATIENT TYPE:  INP   LOCATION:  5019                         FACILITY:  [facility]   PHYSICIAN:  Isidor Holts, M.D.  DATE OF BIRTH:  07/29/1916   DATE OF ADMISSION:  12/18/2005  DATE OF DISCHARGE:  12/27/2005                                 DISCHARGE SUMMARY   ADDENDUM TO DISCHARGE SUMMARY:   DISCHARGE DIAGNOSES:  Refer to interim discharge summary, dated December 25, 2005, by Dr. Hettie Holstein.   DISCHARGE MEDICATIONS:  Refer to above mentioned interim discharge summary.   For presenting illness and details of clinical course, refer to above  mentioned interim discharge summary.  In addition, for the period from December 26, 2005 to December 27, 2005, the patient remained clinically stable.  There  were no new issues.  Hemoglobin has remained stable at 8.8 and renal indices  are stable.  The patient, does not appear to be in acute discomfort from  right humeral fracture.  Atrial fibrillation is controlled, with a rate of  67 per minute, anticoagulation has been commenced, on December 26, 2005, and  per pharmacy instructions, the patient is to continue on her pre-admission  dosages of Coumadin.  This is clearly elucidated in discharge medication  list.   PHYSICAL EXAMINATION:  In the a.m. of December 27, 2005, showed no new physical  findings.   DISPOSITION:  The patient is considered clinically stable for discharge to  skilled nursing facility as originally planned, and she will require  physical therapy/occupational therapy in the outpatient setting and periodic  CBC by her PMD to evaluate the state of her anemia.  She is also recommended  followup with orthopedics, i.e., Dr. Cleophas Dunker, in one to two weeks.      Isidor Holts, M.D.  Electronically Signed     CO/MEDQ  D:  12/27/2005  T:  12/27/2005  Job:  [id number]   cc:   Claude Manges. Cleophas Dunker, M.D.  Fax: [phone]

## 2011-02-25 NOTE — H&P (Signed)
NAMEALLESHA, ARONOFF NO.:  [id number]   MEDICAL RECORD NO.:  [id number]          PATIENT TYPE:  EMS   LOCATION:  MAJO                         FACILITY:  [facility]   PHYSICIAN:  Claude Manges. Whitfield, M.D.DATE OF BIRTH:  [birth year]   DATE OF ADMISSION:  12/18/2005  DATE OF DISCHARGE:                                HISTORY & PHYSICAL   CHIEF COMPLAINT:  Right shoulder pain.   HISTORY:  This 77 year old white female is a resident at the [facility]  assisted living facility.  She fell this evening with immediate onset of  right shoulder pain.  She does not remember loss of consciousness or  tripping.  She does have history of atrial fibrillation and heart blockage  with insertion of a pacemaker greater than 10 years ago at [facility].  She was brought to the emergency room via EMS with films  demonstrating a comminuted four-part displaced right femoral head fracture.  She is now admitted for medical evaluation and clearance of probable humeral  head prosthesis and definitive treatment of her fracture.   PAST MEDICAL HISTORY:  She is allergic to CODEINE.   MEDICATIONS:  1.  She is on multiple medicines including Coumadin 3 mg Monday, Wednesday,      Friday, and Sunday, 4 mg Tuesday, Thursday, and Saturday for atrial      fibrillation.  2.  Aricept 10 mg each morning.  3.  Microzide 12.5 mg daily.  4.  Multivitamin (Sertigen) daily.  5.  Zoloft 125 mg each morning.  6.  Restasis eye drops b.i.d. each eye.  7.  Namenda 10 mg b.i.d.  8.  Os-Cal 500 plus vitamin D t.i.d.  9.  Beano tablets a.c. and h.s.  10. Lunesta 1 mg q.h.s.  11. Risperdal 1 mg q.h.s.  12. Xalatan one drop left eye at bedtime.  13. She also takes Ultram and Tylenol on a p.r.n. basis.   PAST MEDICAL HISTORY:  1.  History of heart blockage.  2.  Atrial fibrillation.  3.  Diverticulosis.  4.  Irritable bowel syndrome.  5.  Chronic interstitial nephritis.  6.  Anxiety.  7.   Varicose veins.  8.  Dysuria.  9.  Dizziness.  10. Giddiness.   PHYSICAL EXAMINATION:  VITAL SIGNS:  Blood pressure 122/78, pulse was  initially elevated at 180.  She was placed on 2 L of nasal O2.  EXTREMITIES:  She had obvious swelling and pain in her right shoulder with  any attempt at range of motion.  Skin was intact.  Neurovascular examination  was intact.  She denied any pain with range of motion of the cervical spine  or pain related to the left upper extremity.  There was no percussible  tenderness of the lumbosacral spine or the pelvis.  There was painless range  of motion of both hips, both knees.  She had some chronic venous stasis  changes with varicose veins both lower extremities and a chronic ulceration  that was a little bit larger than 1 cm in diameter over the anterior distal  third  of her left tibia.  +1 pulses.  NEUROLOGIC:  Appeared to be intact.   Films demonstrated a comminuted widely displaced four-part right humeral  head fracture with medial displacement of the shaft in relation to the head.   PLAN:  Admit for medical evaluation and preoperative clearance.  Most likely  require humeral head replacement as definitive treatment for her humeral  head fracture.  Dr. Sharon Seller will evaluate her from a medical standpoint.      Claude Manges. Cleophas Dunker, M.D.  Electronically Signed     PWW/MEDQ  D:  12/18/2005  T:  12/19/2005  Job:  [id number]

## 2011-02-25 NOTE — Discharge Summary (Signed)
[name], [name] NO.:  [id number]   MEDICAL RECORD NO.:  [id number]          PATIENT TYPE:  INP   LOCATION:  5019                         FACILITY:  [facility]   PHYSICIAN:  Hettie Holstein, D.O.    DATE OF BIRTH:  1916-09-10   DATE OF ADMISSION:  12/18/2005  DATE OF DISCHARGE:                                 DISCHARGE SUMMARY   PRINCIPAL DIAGNOSIS:  Right proximal humerus fracture status post fall.  Initial evaluation initiated plans for hemiarthroplasty.  However, after  review by Dr. Eulah Pont it was felt that she was not a good candidate for  operative repair in the context of her underlying dementia and other medical  issues.  Therefore, all operative procedures were canceled.   ADDITIONAL DIAGNOSES:  1.  Congenital heart block with chronic pacemaker.  Last replacement was in      October of 2003.  2.  Known chronic atrial fibrillation on chronic Coumadin therapy.  This was      held due to consideration for procedure.  However, no procedures to be      undertaken so she can resume coumadinization in the outpatient setting      and resume her previous dosing and have her INRs and PTs followed up by      her primary physician at [facility].  3.  Chronic renal insufficiency, though her BUN and creatinine have been      stable during this hospitalization.  4.  Alzheimer's dementia.  5.  History of interstitial cystitis.  6.  History of anxiety.  7.  Irritable bowel syndrome.  8.  Chronic venous stasis.   MEDICATIONS ON TRANSFER:  1.  She should continue Lunesta 1 mg q.h.s.  2.  Aricept 10 mg q.h.s.  3.  Risperdal 1 mg q.h.s. as before.  4.  HCTZ 12.5 mg daily.  5.  Zoloft 125 mg daily.  6.  Restasis 0.05% one drop into each eye twice daily.  7.  Os-Cal 500 mg p.o. t.i.d.  8.  Namenda 10 mg daily.  9.  Coumadin 3 mg on Monday, Wednesday, Friday, and Sunday and Coumadin 4 mg      on Tuesday, Thursday, Saturday.  She should have her PT/INR followed up  five days following discharge.  That would be December 29, 2005 and her      Coumadin should be adjusted periodically.  10. Xalatan drops 0.005% use in left eye every night at bedtime.  11. Lunesta 1 mg q.h.s. for pain.  12. The patient can have Ultram 50 mg p.o. q.4-6h. p.r.n. pain moderate.  13. For mild pain Tylenol 650 mg p.o. q.4-6h. p.r.n.  14. She can have a Dulcolax suppositories two q.h.s. p.r.n.  15. Fleet's enema p.r.n. constipation.  16. Iron sulfate 325 mg b.i.d.   HISTORY OF PRESENT ILLNESS:  For full details please refer to the H&P  dictated by admitting physician, Dr. Cleophas Dunker.  However, briefly, [name]  is a pleasant 77 year old Caucasian female residing at [facility] assisted  living who had fallen the evening of presentation with immediate onset  of  right shoulder pain.  She had no antecedent symptoms.  She does have a  history of atrial fibrillation with congenital heart block and pacemaker  placement.  She was brought to the emergency department.  That revealed a  comminuted four-part displaced right humeral head fracture.  She is admitted  for operative preparation.   HOSPITAL COURSE:  Patient was admitted to the orthopedic service.  She  underwent reversal of her INR and internal medicine consultation.  She was  evaluated preoperatively by Dr. Jetty Duhamel.  For full details please  refer to the consultative note.  Directions for reversal of her INR was  undertaken.  No further cardiac work-up was felt to be necessary.  She was  prepared for the OR and subsequently did not clear for anesthesia clearance.  Cardiology was consulted and again reiterated that no further testing  required and just recommended placement of the magnet on her chest during  surgery.  This disrupted surgical schedule and therefore [name] did not  undergo surgery.  She was evaluated subsequently by covering orthopedic  surgeon, Dr. Eulah Pont, and after assessment of her clinical course  and  clinical state he did not feel she was an operative candidate and  recommended discharge back to [facility] and noted to follow up with Dr.  Cleophas Dunker in one to two weeks for perhaps reconsideration of this procedure.  In any event, she is felt to be medically stable for discharge back to  [facility] and she can resume coumadinization if she does, in fact, decide  to undergo shoulder surgery in the future she should have her INR reversed  prior to returning.  She was evaluated by Dr. Willa Rough of [facility] who has felt that she was clear to proceed to the operating room  with recommendations as outlined above.  For full details please refer to  the consultative note.  In any event, additional diagnoses include iron  deficiency anemia.  She is asymptomatic at this time and there is no  evidence of gross bleeding.  Would recommend follow-up evaluation of iron  deficiency anemia to the outpatient setting.  We will recommend  supplementing with oral iron replacement.   LABORATORY DATA:  2-D echocardiogram which revealed ejection fraction of  60%.  No clinically significant valvular abnormalities were reported.  Her  EKG revealed a paced rhythm.  TSH was 2.8.  B12 was 339.  RPR was  nonreactive.  Basic metabolic panel revealed serum 409, potassium 3.7, BUN  28, creatinine 1, glucose 99.  Her hemoglobin on discharge was 8.8, platelet  count 208.      Hettie Holstein, D.O.  Electronically Signed     ESS/MEDQ  D:  12/25/2005  T:  12/26/2005  Job:  [id number]   cc:   Claude Manges. Cleophas Dunker, M.D.  Fax: [phone]   Loreta Ave, M.D.  Fax: [phone]   Lenon Curt. Chilton Si, M.D.  Fax: [phone]

## 2011-02-25 NOTE — Discharge Summary (Signed)
   [name], [name]                          ACCOUNT NO.:  [id number]   MEDICAL RECORD NO.:  [id number]                   PATIENT TYPE:  OIB   LOCATION:  2906                                 FACILITY:  [facility]   PHYSICIAN:  Duke Salvia, M.D. [facility]           DATE OF BIRTH:  03-06-1916   DATE OF ADMISSION:  08/05/2002  DATE OF DISCHARGE:  08/06/2002                                 DISCHARGE SUMMARY   PRIMARY DIAGNOSIS:  Permanent pacemaker at [facility].   HISTORY OF PRESENT ILLNESS:  This is an 77 year old female of Dr. Daleen Squibb with  a history of congenital heart block status post electronic pacemaker who is  followed by Dr. Sherryl Manges. The patient also has a history of atrial  fibrillation on chronic Coumadin therapy, severe chronic venous  insufficiency, dependent edema, history of normal LV function. The patient  was seen and admitted for a permanent pacemaker change out. The patient  underwent permanent pacemaker [employer] change out with wire change on  08/05/02. She tolerated the procedure well and had no immediate  postoperative complications. The patient's wire was a recalled wire that was  replaced, and the lead was capped. She tolerated the procedure well and had  no immediate postoperative complications, although she was complaining of a  headache prior to admission and on day of discharge. This was discussed with  Dr. Penni Bombard and Dr. Graciela Husbands, and the patient was to be discharged to home to  followup with Dr. Penni Bombard for her headache. The patient also was noted to  have hypertension during hospitalization, and hydrochlorothiazide was added  to her medical regimen.   DISCHARGE INSTRUCTIONS:  She was discharged on Inderal LA 80 daily,  hydrochlorothiazide 12.5 mg daily, Coumadin 2.5 nightly, Elavil 25 nightly,  Prevacid 30 daily. She is to take Tylenol one to two tablets as needed for  pain. Her activity and wound care were as per the supplemental discharge  instruction sheet. She  was scheduled to be seen in the Coumadin clinic on  Friday, 10/31, at 11:15 a.m.; the pacemaker clinic at [facility] on 11/6 at  9:15 a.m.; and with Dr. Graciela Husbands 12/16 at 10 a.m.     Chinita Pester, C.R.N.P. [facility]                 Duke Salvia, M.D. [facility]    DS/MEDQ  D:  08/06/2002  T:  08/06/2002  Job:  [id number]   cc:   Pacemaker clinic at [facility]ou, M.D.  [address] [place], [place] [address]  Fax: [phone]

## 2011-03-17 IMAGING — CR DG CHEST 1V PORT
1 series · 1 of 1 positions shown · non-contrast
Comparison: 10/23/2007.

CLINICAL DATA: Altered level of consciousness.  Fever.  Diarrhea.

PORTABLE CHEST - 1 VIEW

[view not recorded]
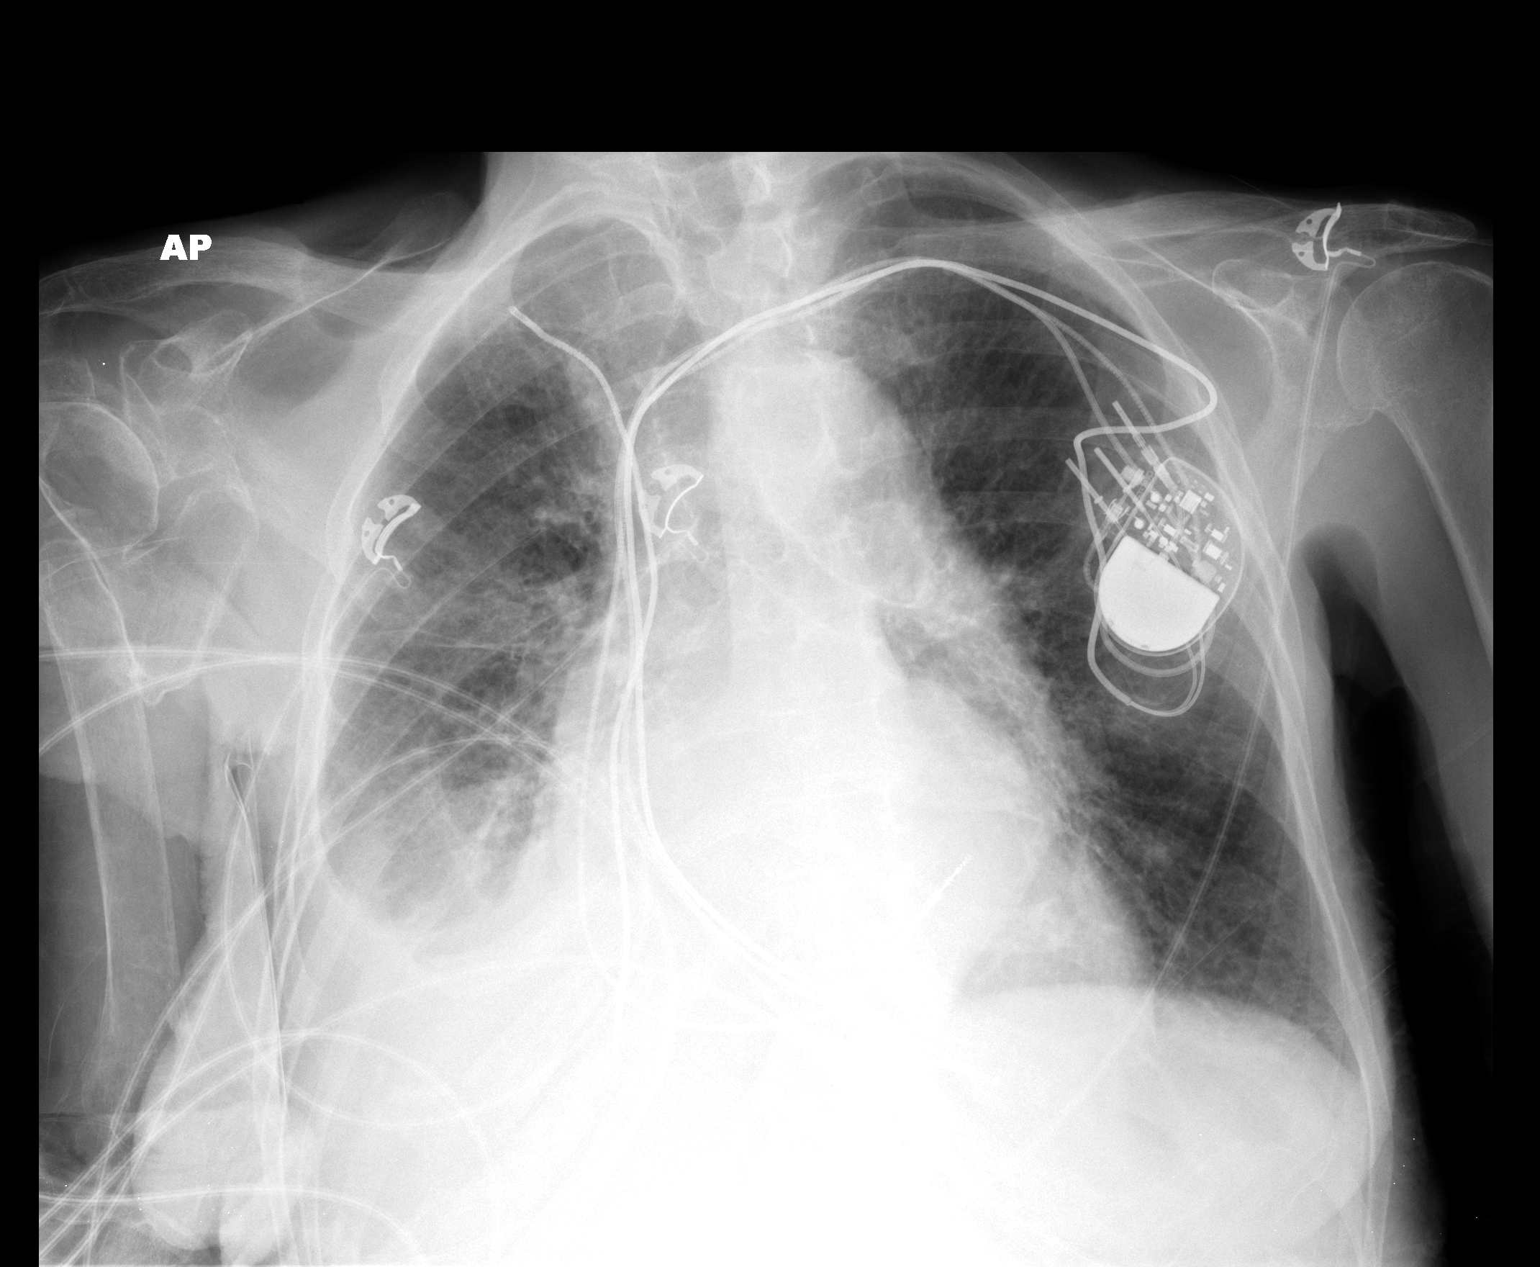

[1 of 1 positions shown; findings below may reference images not displayed]

FINDINGS: New right pleural effusion with atelectasis.  There has
been interval change in the arrangement of the pacemaker leads.
Right subclavian pacemaker lead is present which travels along the
vena cava through the right atrium with free end in the inferior
vena cava; the terminal end of this lead, which was intact on prior
is unchanged over the right ventricular apex.  The coronary sinus
and right ventricular apex leads appear unchanged.

Chronic interstitial prominence is unchanged compared to prior.
The patient is rotated to the right.  Cardiomegaly noted on prior
exam is probably unchanged.  Extensive calcification of the
tracheobronchial tree. Post-traumatic changes are noted in the
proximal right humerus consistent with old fracture with
heterotopic bone.  Tortuous descending thoracic aorta is
accentuated by rotation on today's exam.
IMPRESSION: New right pleural effusion with right basilar atelectasis.
Probable superimposed airspace disease which may represent
infection or pulmonary edema.

New broken right subclavian pacemaker lead compared to 0990.  The
distal tip appears imbedded in the right ventricular apex with a
free end that appears to extend into the inferior vena cava.

## 2011-06-07 ENCOUNTER — Encounter: Payer: Self-pay | Admitting: *Deleted

## 2011-06-30 LAB — PREPARE FRESH FROZEN PLASMA

## 2011-06-30 LAB — BASIC METABOLIC PANEL
BUN: 17
BUN: 18
BUN: 19
BUN: 26 — ABNORMAL HIGH
BUN: 30 — ABNORMAL HIGH
BUN: 33 — ABNORMAL HIGH
BUN: 24 — ABNORMAL HIGH
CO2: 25
CO2: 26
CO2: 27
CO2: 27
CO2: 27
CO2: 27
CO2: 30
Calcium: 8.1 — ABNORMAL LOW
Calcium: 8.1 — ABNORMAL LOW
Calcium: 8.1 — ABNORMAL LOW
Calcium: 8.1 — ABNORMAL LOW
Calcium: 8.4
Calcium: 8.5
Calcium: 8.6
Calcium: 8.8
Chloride: 104
Chloride: 104
Chloride: 104
Chloride: 108
Chloride: 112
Chloride: 113 — ABNORMAL HIGH
Chloride: 109
Creatinine, Ser: 0.73
Creatinine, Ser: 0.75
Creatinine, Ser: 0.83
Creatinine, Ser: 0.85
Creatinine, Ser: 1.06
Creatinine, Ser: 1.21 — ABNORMAL HIGH
Creatinine, Ser: 1.26 — ABNORMAL HIGH
GFR calc Af Amer: 48 — ABNORMAL LOW
GFR calc Af Amer: 51 — ABNORMAL LOW
GFR calc Af Amer: 59 — ABNORMAL LOW
GFR calc Af Amer: >60
GFR calc Af Amer: >60
GFR calc Af Amer: >60
GFR calc Af Amer: >60
GFR calc Af Amer: >60
GFR calc non Af Amer: 40 — ABNORMAL LOW
GFR calc non Af Amer: 48 — ABNORMAL LOW
GFR calc non Af Amer: 49 — ABNORMAL LOW
GFR calc non Af Amer: 49 — ABNORMAL LOW
GFR calc non Af Amer: >60
GFR calc non Af Amer: >60
GFR calc non Af Amer: >60
Glucose, Bld: 105 — ABNORMAL HIGH
Glucose, Bld: 120 — ABNORMAL HIGH
Glucose, Bld: 145 — ABNORMAL HIGH
Glucose, Bld: 97
Potassium: 3 — ABNORMAL LOW
Potassium: 3.2 — ABNORMAL LOW
Potassium: 3.4 — ABNORMAL LOW
Potassium: 3.6
Potassium: 4.3
Potassium: 4.5
Sodium: 138
Sodium: 146 — ABNORMAL HIGH
Sodium: 147 — ABNORMAL HIGH
Sodium: 148 — ABNORMAL HIGH
Sodium: 145

## 2011-06-30 LAB — URINALYSIS, ROUTINE W REFLEX MICROSCOPIC
Bilirubin Urine: NEGATIVE
Ketones, ur: NEGATIVE
Ketones, ur: NEGATIVE
Nitrite: NEGATIVE
Nitrite: NEGATIVE
Urobilinogen, UA: 0.2
pH: 5.5

## 2011-06-30 LAB — DIFFERENTIAL
Basophils Absolute: 0
Basophils Relative: 0
Eosinophils Absolute: 0.1
Eosinophils Relative: 1
Lymphocytes Relative: 15

## 2011-06-30 LAB — BLOOD GAS, ARTERIAL
Drawn by: 295031
O2 Content: 2
Patient temperature: 98.6
pCO2 arterial: 32.9 — ABNORMAL LOW
pH, Arterial: 7.49 — ABNORMAL HIGH

## 2011-06-30 LAB — CBC
HCT: 24.4 — ABNORMAL LOW
HCT: 30.3 — ABNORMAL LOW
HCT: 31.7 — ABNORMAL LOW
Hemoglobin: 10.4 — ABNORMAL LOW
Hemoglobin: 8.4 — ABNORMAL LOW
MCHC: 34.4
MCHC: 34.5
MCHC: 34.6
MCHC: 34.7
MCHC: 34.9
MCHC: 35
MCHC: 35.3
MCV: 91
MCV: 91.9
MCV: 92.5
MCV: 92.7
Platelets: 117 — ABNORMAL LOW
Platelets: 119 — ABNORMAL LOW
Platelets: 145 — ABNORMAL LOW
Platelets: 153
Platelets: 166
RBC: 2.57 — ABNORMAL LOW
RBC: 2.83 — ABNORMAL LOW
RBC: 2.99 — ABNORMAL LOW
RBC: 3.33 — ABNORMAL LOW
RDW: 13.9
RDW: 14.2
RDW: 14.7
RDW: 14.8
WBC: 10.4
WBC: 8.3
WBC: 9.1

## 2011-06-30 LAB — CROSSMATCH: Antibody Screen: NEGATIVE

## 2011-06-30 LAB — HEMOGLOBIN AND HEMATOCRIT, BLOOD: HCT: 30.3 — ABNORMAL LOW

## 2011-06-30 LAB — CARDIAC PANEL(CRET KIN+CKTOT+MB+TROPI)
CK, MB: 5.4 — ABNORMAL HIGH
CK, MB: 5.4 — ABNORMAL HIGH
Relative Index: 0.9
Relative Index: 2.3
Total CK: 182 — ABNORMAL HIGH
Total CK: 600 — ABNORMAL HIGH
Troponin I: 0.07 — ABNORMAL HIGH

## 2011-06-30 LAB — PROTIME-INR
INR: 1.2
INR: 1.2
INR: 1.4
INR: 1.9 — ABNORMAL HIGH
INR: 2.2 — ABNORMAL HIGH
INR: 2.3 — ABNORMAL HIGH
INR: 2.6 — ABNORMAL HIGH
Prothrombin Time: 15.2
Prothrombin Time: 15.9 — ABNORMAL HIGH
Prothrombin Time: 17 — ABNORMAL HIGH
Prothrombin Time: 17.8 — ABNORMAL HIGH
Prothrombin Time: 24.9 — ABNORMAL HIGH
Prothrombin Time: 25.4 — ABNORMAL HIGH
Prothrombin Time: 25.9 — ABNORMAL HIGH
Prothrombin Time: 27.9 — ABNORMAL HIGH
Prothrombin Time: 28.8 — ABNORMAL HIGH

## 2011-06-30 LAB — TYPE AND SCREEN: ABO/RH(D): A POS

## 2011-06-30 LAB — URINE CULTURE
Colony Count: NO GROWTH
Culture: NO GROWTH
Special Requests: NEGATIVE

## 2011-06-30 LAB — PREPARE RBC (CROSSMATCH)

## 2011-06-30 LAB — ABO/RH: ABO/RH(D): A POS

## 2011-06-30 LAB — CK TOTAL AND CKMB (NOT AT ARMC): Relative Index: INVALID

## 2011-07-22 LAB — CBC
Hemoglobin: 11.7 — ABNORMAL LOW
MCHC: 34.5
Platelets: 159
RBC: 3.62 — ABNORMAL LOW
RDW: 14.7 — ABNORMAL HIGH
WBC: 9.3

## 2011-07-22 LAB — DIFFERENTIAL
Basophils Absolute: 0
Eosinophils Absolute: 0.2
Eosinophils Relative: 2
Lymphs Abs: 2.2

## 2011-07-22 LAB — PROTIME-INR: Prothrombin Time: 23.7 — ABNORMAL HIGH

## 2011-09-27 ENCOUNTER — Encounter: Payer: Self-pay | Admitting: *Deleted

## 2011-09-29 ENCOUNTER — Encounter: Payer: Self-pay | Admitting: Internal Medicine

## 2011-10-11 DIAGNOSIS — G8929 Other chronic pain: Secondary | ICD-10-CM

## 2011-10-11 HISTORY — DX: Other chronic pain: G89.29

## 2011-11-21 ENCOUNTER — Telehealth: Payer: Self-pay | Admitting: Internal Medicine

## 2011-11-21 NOTE — Telephone Encounter (Signed)
09-29-11 sent past due letter/mt 11-21-11 lmm @ 1034am for [facility] to set up pacemaker check, due with klein but device ok/mt

## 2012-07-05 ENCOUNTER — Telehealth: Payer: Self-pay | Admitting: Internal Medicine

## 2012-07-05 NOTE — Telephone Encounter (Signed)
07-05-12 lm at [facility] to set up pacer ck with klein/mt

## 2012-12-15 ENCOUNTER — Encounter: Payer: Self-pay | Admitting: Geriatric Medicine

## 2012-12-15 DIAGNOSIS — F419 Anxiety disorder, unspecified: Secondary | ICD-10-CM | POA: Insufficient documentation

## 2012-12-15 DIAGNOSIS — G309 Alzheimer's disease, unspecified: Secondary | ICD-10-CM

## 2012-12-15 DIAGNOSIS — G8929 Other chronic pain: Secondary | ICD-10-CM | POA: Insufficient documentation

## 2012-12-15 DIAGNOSIS — F0280 Dementia in other diseases classified elsewhere without behavioral disturbance: Secondary | ICD-10-CM

## 2012-12-15 DIAGNOSIS — R634 Abnormal weight loss: Secondary | ICD-10-CM | POA: Insufficient documentation

## 2012-12-15 DIAGNOSIS — R627 Adult failure to thrive: Secondary | ICD-10-CM | POA: Insufficient documentation

## 2012-12-15 DIAGNOSIS — K5900 Constipation, unspecified: Secondary | ICD-10-CM | POA: Insufficient documentation

## 2013-01-24 ENCOUNTER — Encounter: Payer: Self-pay | Admitting: Geriatric Medicine

## 2013-01-24 ENCOUNTER — Non-Acute Institutional Stay (SKILLED_NURSING_FACILITY): Payer: Medicare Other | Admitting: Geriatric Medicine

## 2013-01-24 DIAGNOSIS — F482 Pseudobulbar affect: Secondary | ICD-10-CM

## 2013-01-24 DIAGNOSIS — F0280 Dementia in other diseases classified elsewhere without behavioral disturbance: Secondary | ICD-10-CM

## 2013-01-24 NOTE — Progress Notes (Signed)
Patient ID: [name], female   DOB: November 03, 1915, 79 y.o.   MRN: [id number] Chief Complaint  Patient presents with  . Medical Managment of Chronic Issues    AD, medication    HPI: This 79 year old female resident is evaluated today for management of medical chronic medical issues. Her main medical issue is Alzheimer's disease with behavioral disturbances. This patient has been medicated over the years with muscle medications to use her anxiety agitation. Of late her most telling symptom is yelling out with little or no provocation. She does have chronic pain issues and receives scheduled doses of Vicodin as well as occasional doses of morphine. These medications sometimes help reduce her outburst but most often they don't. The outbursts are unpredictable, occur quickly and frequently resolved quickly.  Allergies Reviewed   Medications Reviewed   Data Reviewed    Radiology:   [id number], external 08/28/12 WBC 7.8, Hgb 11.4, Hct 33.9, Plt 176 Glu 82, BUN .81, Na 140, K+3.6. protein/LFTsWNL     Review of Systems   DATA OBTAINED: from  nurse, medical record,  GENERAL:  No fevers, change in activity status. Poor appetite, weight loss  SKIN: No itch, rash or open wounds EYES: No eye pain, dryness or itching.  EARS: No earache,  NOSE: No congestion, drainage or bleeding MOUTH/THROAT: No mouth or tooth pain. No difficulty chewing or swallowing. RESPIRATORY: No cough, wheezing,  CARDIAC: No chest pain,   No edema. GI: No abdominal pain. No N/V/D or constipation. No heartburn or reflux. Incontinent GU: No dysuria. No change in urine volume or character Incontinent MUSCULOSKELETAL: Chronic right shoulder pain. Nonambulatory NEUROLOGIC: No dizziness, fainting, headache, No change in mental status.  PSYCHIATRIC: See history of present illness Sleeps well.   Physical Exam  Filed Vitals:   01/24/13 1711  BP: 100/78  Pulse: 69  Temp: 96.8 F (36 C)  Resp: 27   GENERAL APPEARANCE: No  acute distress, appropriately groomed, frail, cachectic body habitus. Awake responsive nonconversant  HEAD: Normocephalic, atraumatic EYES: Conjunctiva/lids clear. Pupils round, reactive.   EARS: External exam WNL,  NOSE: No deformity or discharge. MOUTH/THROAT: Lips w/o lesions. Oral mucosa, tongue moist, w/o lesion. Oropharynx w/o redness or lesions.  NECK: Supple, full ROM. No thyroid tenderness, enlargement or nodule LYMPHATICS: No head, neck or supraclavicular adenopathy RESPIRATORY: Breathing is even, unlabored. Lung sounds are clear and full.  CARDIOVASCULAR: Heart RRR. No murmur or extra heart sounds   EDEMA: No peripheral  Edema GASTROINTESTINAL: Abdomen is soft, non-tender, not distended w/ normal bowel sounds.  MUSCULOSKELETAL: Limited spontaneous movement, all joints are stiff  NEUROLOGIC: Not Oriented to time, place or person. Speech unintelligible, no tremor.  PSYCHIATRIC: Easily agitated  ASSESSMENT/PLAN  AD (Alzheimer's disease) Pt. Continues to yell out with/without interactions. With personal care she consistently yells out (out of proportion with the activity). Is also frequently observed yelling out for no apparent reason, then stops abruptly. Pt. Has been treated with Depakene in attempt to modulate mood. This has not been effective, pt has has no change with the most recent dose reduction of Depakene. This pt's behavior is c/w Pseudobulbar Affect, will try Nuedexta.  Pseudobulbar affect This pt. Has demonstrated agitated behavior attributed to AD for several years. Attempts to modulate mood and behavior with pharmacologic an non pharmacologic interventions have not been very successful. Staff is able to provide care despite the yelling because pt. Is now very debilitated. The yelling out consistenyly occurs with any personal care and with most interactions. It occurs  quickly and often ends abrubptly as well. Will try Nuedexta to reduce the number and severity of these  episodes    Follow up: Routine and as needed  Tiya Schrupp T.Estelle Greenleaf, NP-C 01/24/2013

## 2013-01-24 NOTE — Assessment & Plan Note (Signed)
Pt. Continues to yell out with/without interactions. With personal care she consistently yells out (out of proportion with the activity). Is also frequently observed yelling out for no apparent reason, then stops abruptly. Pt. Has been treated with Depakene in attempt to modulate mood. This has not been effective, pt has has no change with the most recent dose reduction of Depakene. This pt's behavior is c/w Pseudobulbar Affect, will try Nuedexta.

## 2013-01-24 NOTE — Assessment & Plan Note (Signed)
This pt. Has demonstrated agitated behavior attributed to AD for several years. Attempts to modulate mood and behavior with pharmacologic an non pharmacologic interventions have not been very successful. Staff is able to provide care despite the yelling because pt. Is now very debilitated. The yelling out consistenyly occurs with any personal care and with most interactions. It occurs quickly and often ends abrubptly as well. Will try Nuedexta to reduce the number and severity of these episodes

## 2013-02-11 ENCOUNTER — Encounter: Payer: Self-pay | Admitting: Geriatric Medicine

## 2013-02-11 ENCOUNTER — Non-Acute Institutional Stay (SKILLED_NURSING_FACILITY): Payer: Medicare Other | Admitting: Geriatric Medicine

## 2013-02-11 DIAGNOSIS — F482 Pseudobulbar affect: Secondary | ICD-10-CM

## 2013-02-11 DIAGNOSIS — Z66 Do not resuscitate: Secondary | ICD-10-CM

## 2013-02-11 NOTE — Assessment & Plan Note (Signed)
His nursing staff reports patient's outbursts are less often and less severe. Continue dextra b.i.d. dosing, will stop Depakene.

## 2013-02-11 NOTE — Progress Notes (Signed)
Patient ID: [name], female   DOB: 1916/03/24, 79 y.o.   MRN: [id number] Chief Complaint  Patient presents with  . F/U PBA    HPI: This 79 year old female resident is evaluated today in follow up of Pseudobulbar affect. Patient was started on new dextra approximately 2 weeks ago. Review of nurses notes and speaking to several nurses with shows that patient's verbal outbursts have been less often and less severe since starting this new medication. Patient has not demonstrated any adverse effects from the medication. She remains quite debilitated with advanced stage dementia, p.o. intake is poor and she has had continued weight loss.  Allergies Reviewed   Medications Reviewed   Data Reviewed    Radiology:   [id number], external 08/28/12 WBC 7.8, Hgb 11.4, Hct 33.9, Plt 176 Glu 82, BUN .81, Na 140, K+3.6. protein/LFTsWNL     Review of Systems   DATA OBTAINED: from  nurse, medical record,  GENERAL:  No fevers, change in activity status. Poor appetite, weight loss  SKIN: No itch, rash or open wounds EYES: No eye pain, dryness or itching.  EARS: No earache,  NOSE: No congestion, drainage or bleeding MOUTH/THROAT: No mouth or tooth pain. No difficulty chewing or swallowing. RESPIRATORY: No cough, wheezing,  CARDIAC: No chest pain,   No edema. GI: No abdominal pain. No N/V/D or constipation. No heartburn or reflux. Incontinent GU: No dysuria. No change in urine volume or character Incontinent MUSCULOSKELETAL: Chronic right shoulder pain. Nonambulatory NEUROLOGIC: No dizziness, fainting, headache, No change in mental status.  PSYCHIATRIC: See history of present illness Sleeps well.   Physical Exam  Filed Vitals:   02/11/13 1708  BP: 157/94  Pulse: 70  Temp: 98.2 F (36.8 C)  Resp: 20   GENERAL APPEARANCE: No acute distress, appropriately groomed, frail, cachectic body habitus. Awake responsive nonconversant  HEAD: Normocephalic, atraumatic EYES: Conjunctiva/lids clear.  Pupils round, reactive.   EARS: External exam WNL,  NOSE: No deformity or discharge. MOUTH/THROAT: Lips w/o lesions. Oral mucosa, tongue moist, w/o lesion. Oropharynx w/o redness or lesions.  NECK: Supple, full ROM. No thyroid tenderness, enlargement or nodule LYMPHATICS: No head, neck or supraclavicular adenopathy RESPIRATORY: Breathing is even, unlabored. Lung sounds are clear and full.  CARDIOVASCULAR: Heart RRR. No murmur or extra heart sounds   EDEMA: No peripheral  Edema GASTROINTESTINAL: Abdomen is soft, non-tender, not distended w/ normal bowel sounds.  MUSCULOSKELETAL: Limited spontaneous movement, all joints are stiff  NEUROLOGIC: Not Oriented to time, place or person. Speech unintelligible, no tremor.  PSYCHIATRIC: Easily agitated  ASSESSMENT/PLAN  Pseudobulbar affect His nursing staff reports patient's outbursts are less often and less severe. Continue dextra b.i.d. dosing, will stop Depakene.    Follow up: Routine and as needed  Elizah Mierzwa T.Jerelyn Trimarco, NP-C 02/11/2013

## 2013-03-22 ENCOUNTER — Telehealth: Payer: Self-pay | Admitting: Internal Medicine

## 2013-03-22 NOTE — Telephone Encounter (Addendum)
03-22-13 lmm @ 332pm for nursing home to let us know id pt having device checks done there or not at all do to condition/mt 03-25-13 348pm cindy w/pt's nursing home called back to let us know pt not having checks done now only comfort care/mt

## 2013-05-06 ENCOUNTER — Other Ambulatory Visit: Payer: Self-pay | Admitting: Geriatric Medicine

## 2013-05-06 MED ORDER — HYDROCODONE-ACETAMINOPHEN 7.5-325 MG PO TABS: ORAL_TABLET | ORAL | Status: DC

## 2013-05-10 ENCOUNTER — Non-Acute Institutional Stay (SKILLED_NURSING_FACILITY): Payer: Medicare Other | Admitting: Geriatric Medicine

## 2013-05-10 DIAGNOSIS — L989 Disorder of the skin and subcutaneous tissue, unspecified: Secondary | ICD-10-CM

## 2013-05-10 NOTE — Progress Notes (Signed)
Patient ID: [name], female   DOB: Nov 15, 1915, 79 y.o.   MRN: [id number] [facility] (31)  Code Status: Living Will, DNR Contact Information   Name Relation Home Work Mobile   [facility]  [phone]        Chief Complaint  Patient presents with  . skin lesion    HPI: This is a 79 y.o. female resident of [facility], Skilled Nursing section.  Evaluation is requested today due to a skin lesion behind the patient's left ear.  Nurse tells me this was noted just today.  Allergies  Allergen Reactions  . Codeine   . Hydrocodone    Medications Reviewed   Review of Systems  DATA OBTAINED: from nurse, medical record GENERAL:  No fevers, fatigue, change in appetite. Continues slow weight loss SKIN: No itch, rash New lesion left mastoid area RESPIRATORY: No cough, wheezing, SOB NEUROLOGIC:  No change in mental status (severe dementia).  PSYCHIATRIC: Intermittent expression of  anxiety Sleeps well.    Physical Exam GENERAL APPEARANCE: No acute distress, appropriately groomed, Frail, cachectic body habitus. Alert, responsive, language incoherent. SKIN: No diaphoresis, rash.  At left mastoid process area is a dry, thick protruding growth. On closer examination,m appears to be dried blood. Area was cleansed with betadine, the dried blood was removed sharply. Base of lesionis slightly raised, pearly, red. No active bleeding. Covered with Mepitel dressing  HEAD: Normocephalic, atraumatic EYES: Conjunctiva/lids clear.   NECK: Supple, full ROM. No thyroid tenderness, enlargement or nodule LYMPHATICS: No head, neck or supraclavicular adenopathy RESPIRATORY: Breathing is even, unlabored.  CARDIOVASCULAR: Heart RRR. No murmur or extra heart sounds NEUROLOGIC: Unable to assess orientation.  PSYCHIATRIC: Mood and affect appropriate to situation  ASSESSMENT/PLAN  Skin lesion, superficial Superficial skin lesion at left mastoid process has  appearance of skin cancer. Apparently was irritated, pt. likely scratched it causing bleeding. Pt. Is not candidate for dermatology evaluation outside facility. Will keep covered to avoid further irritation/bleeding    Follow up: Routine or as needed  [name] T.Kiaan Overholser, NP-C 05/10/2013

## 2013-05-19 DIAGNOSIS — L989 Disorder of the skin and subcutaneous tissue, unspecified: Secondary | ICD-10-CM | POA: Insufficient documentation

## 2013-05-19 NOTE — Assessment & Plan Note (Signed)
Superficial skin lesion at left mastoid process has appearance of skin cancer. Apparently was irritated, pt. likely scratched it causing bleeding. Pt. Is not candidate for dermatology evaluation outside facility. Will keep covered to avoid further irritation/bleeding

## 2013-06-11 ENCOUNTER — Non-Acute Institutional Stay (SKILLED_NURSING_FACILITY): Payer: Medicare Other | Admitting: Geriatric Medicine

## 2013-06-11 ENCOUNTER — Encounter: Payer: Self-pay | Admitting: Geriatric Medicine

## 2013-06-11 DIAGNOSIS — F0280 Dementia in other diseases classified elsewhere without behavioral disturbance: Secondary | ICD-10-CM

## 2013-06-11 MED ORDER — LORAZEPAM 0.5 MG PO TABS: 0.5000 mg | ORAL_TABLET | ORAL | Status: AC | PRN

## 2013-06-11 MED ORDER — MORPHINE SULFATE (CONCENTRATE) 20 MG/ML PO SOLN: 5.0000 mg | Freq: Three times a day (TID) | ORAL | Status: AC

## 2013-06-11 NOTE — Progress Notes (Signed)
Patient ID: Arliss Journey, female   DOB: 02-15-1916, 79 y.o.   MRN: [id number] [facility] SN[facility] (31)  Code Status: Living Will, DNR Contact Information   Name Relation Home Work Mobile   [facility]  [phone]         Chief Complaint  Patient presents with  . Emesis    HPI: This is a 79 y.o. female resident of [facility],  Skilled Nursing section.  Evaluation is requested today due to vomiting, weight loss, poor PO intake.  Review of record shows patient has lost 10 pounds in the last month, her p.o. intake has remained fair to poor during this time, continues to accept adequate amounts of fluids. This is not new; patient has been declining for some time.   Bathing: Dependent Bed Mobility: Dependent Bladder Management: Diapers / Pads, Bowel Management: Diapers / Pads Feeding: Dependent Hygiene and Grooming: Dependent, Toileting / Clothing: Dependent    Allergies  Allergen Reactions  . Codeine   . Hydrocodone    Medications Reviewed  DATA REVIEWED  Radiologic Exams:   Cardiovascular Exams:   Laboratory Studies  [facility] lab 08/28/12 WBC 7.8, Hgb 11.4, Hct 33.9, Plt 176   Glu 82, BUN .81, Na 140, K+3.6. protein/LFTsWNL     Review of Systems   DATA OBTAINED: from  nurse, medical record,   GENERAL:  No fevers, limited activity status. Poor appetite, weight loss  SKIN: No itch, rash or open wounds EYES: No eye pain, dryness or itching.  EARS: No earache  NOSE: No congestion, drainage or bleeding MOUTH/THROAT: No mouth or tooth pain. No difficulty chewing or swallowing. RESPIRATORY: No cough, wheezing,   CARDIAC: No chest pain,   No edema. GI: No abdominal pain. No N/V/D or constipation. No heartburn or reflux. Incontinent GU: No dysuria. No change in urine volume or character Incontinent MUSCULOSKELETAL: Chronic right shoulder pain. Nonambulatory NEUROLOGIC: No dizziness, fainting, headache, No change in mental status.    PSYCHIATRIC: See history of present illness Sleeps well.    Physical Exam Filed Vitals:   06/11/13 1229  BP: 115/77  Pulse: 62  Temp: 97.5 F (36.4 C)  Resp: 17  Height: 5\' 4"  (1.626 m)  Weight: 93 lb 6.4 oz (42.366 kg)  SpO2: 97%   Body mass index is 16.02 kg/(m^2).  GENERAL APPEARANCE: No acute distress, appropriately groomed, frail, cachectic body habitus. Awake responsive nonconversant  HEAD: Normocephalic, atraumatic EYES: Conjunctiva/lids clear. Pupils round, reactive.    EARS: External exam WNL,  NOSE: No deformity or discharge. MOUTH/THROAT: Lips w/o lesions.  Mouth with food/ or medication debris LYMPHATICS: No head, neck or supraclavicular adenopathy RESPIRATORY: Breathing is even, unlabored. Lung sounds are clear and full.   CARDIOVASCULAR: Heart RRR. No murmur or extra heart sounds                         EDEMA: No peripheral  edema GASTROINTESTINAL: Abdomen is soft, non-tender, not distended w/ normal bowel sounds.   MUSCULOSKELETAL: Limited spontaneous movement, all joints are stiff   NEUROLOGIC: Not Oriented to time, place or person. Speech unintelligible, no tremor.  PSYCHIATRIC: Easily agitated  ASSESSMENT/PLAN  No problem-specific assessment & plan notes found for this encounter.    Follow up:   Vinita Prentiss T.Huxton Glaus, NP-C 06/11/2013

## 2013-06-11 NOTE — Assessment & Plan Note (Signed)
The patient has had some improvement in severity a verbal outbursts since starting Neudexta, however in the last month she has had significant weight loss, decreased p.o. intake, more episodes of spitting out pills. She is likely not gaining any more benefit from this medication, will stop. Will limit oral medications to liquid morphine for pain management and Ativan for anxiety and agitation management.  Decreased PO intake is part of natural progression of AD, nursing staff will continue to offer food/fluids when patient is awake. Will stop meal if she falls asleep or is holding food in her mouth. Goal of care is comfort, not life prolonging measures.

## 2013-07-10 DEATH — deceased

## 2015-08-20 NOTE — Progress Notes (Signed)
This encounter was created in error - please disregard.
